# Patient Record
Sex: Female | Born: 1975 | Race: Black or African American | Hispanic: No | Marital: Married | Smoking: Current every day smoker
Health system: Southern US, Community
[De-identification: ages and names within clinical notes are randomized; demographics above are authoritative.]

## PROBLEM LIST (undated history)

## (undated) DIAGNOSIS — G589 Mononeuropathy, unspecified: Secondary | ICD-10-CM

## (undated) DIAGNOSIS — N809 Endometriosis, unspecified: Secondary | ICD-10-CM

## (undated) DIAGNOSIS — I10 Essential (primary) hypertension: Secondary | ICD-10-CM

## (undated) DIAGNOSIS — M549 Dorsalgia, unspecified: Secondary | ICD-10-CM

## (undated) DIAGNOSIS — K219 Gastro-esophageal reflux disease without esophagitis: Secondary | ICD-10-CM

## (undated) DIAGNOSIS — M5416 Radiculopathy, lumbar region: Secondary | ICD-10-CM

## (undated) DIAGNOSIS — F329 Major depressive disorder, single episode, unspecified: Secondary | ICD-10-CM

## (undated) DIAGNOSIS — I209 Angina pectoris, unspecified: Secondary | ICD-10-CM

## (undated) DIAGNOSIS — M419 Scoliosis, unspecified: Secondary | ICD-10-CM

## (undated) DIAGNOSIS — F32A Depression, unspecified: Secondary | ICD-10-CM

## (undated) DIAGNOSIS — G8929 Other chronic pain: Secondary | ICD-10-CM

## (undated) DIAGNOSIS — M1990 Unspecified osteoarthritis, unspecified site: Secondary | ICD-10-CM

## (undated) HISTORY — PX: TUBAL LIGATION: SHX77

## (undated) HISTORY — DX: Angina pectoris, unspecified: I20.9

## (undated) HISTORY — DX: Unspecified osteoarthritis, unspecified site: M19.90

## (undated) HISTORY — PX: OTHER SURGICAL HISTORY: SHX169

## (undated) HISTORY — DX: Mononeuropathy, unspecified: G58.9

## (undated) HISTORY — DX: Major depressive disorder, single episode, unspecified: F32.9

## (undated) HISTORY — DX: Depression, unspecified: F32.A

## (undated) HISTORY — PX: HERNIA REPAIR: SHX51

## (undated) HISTORY — PX: CYST REMOVAL HAND: SHX6279

## (undated) HISTORY — DX: Endometriosis, unspecified: N80.9

---

## 2007-11-20 ENCOUNTER — Emergency Department (HOSPITAL_COMMUNITY): Admission: EM | Admit: 2007-11-20 | Discharge: 2007-11-20 | Payer: Self-pay | Admitting: Emergency Medicine

## 2009-04-23 ENCOUNTER — Encounter: Payer: Self-pay | Admitting: Obstetrics & Gynecology

## 2010-11-29 LAB — POCT CARDIAC MARKERS: Myoglobin, poc: 46

## 2011-11-03 ENCOUNTER — Other Ambulatory Visit (HOSPITAL_COMMUNITY)
Admission: RE | Admit: 2011-11-03 | Discharge: 2011-11-03 | Disposition: A | Discharge status: Home / Self Care | Payer: Self-pay | Source: Ambulatory Visit | Attending: Obstetrics & Gynecology | Admitting: Obstetrics & Gynecology

## 2011-11-03 DIAGNOSIS — Z01419 Encounter for gynecological examination (general) (routine) without abnormal findings: Secondary | ICD-10-CM | POA: Insufficient documentation

## 2012-03-27 ENCOUNTER — Other Ambulatory Visit (HOSPITAL_COMMUNITY): Payer: Self-pay | Admitting: Family Medicine

## 2012-03-27 ENCOUNTER — Other Ambulatory Visit (HOSPITAL_COMMUNITY): Payer: Self-pay | Admitting: *Deleted

## 2012-03-27 DIAGNOSIS — R51 Headache: Secondary | ICD-10-CM

## 2012-03-30 ENCOUNTER — Ambulatory Visit (HOSPITAL_COMMUNITY): Payer: Medicaid Other

## 2012-04-02 ENCOUNTER — Ambulatory Visit (HOSPITAL_COMMUNITY)
Admission: RE | Admit: 2012-04-02 | Discharge: 2012-04-02 | Disposition: A | Discharge status: Home / Self Care | Payer: Medicaid Other | Source: Ambulatory Visit | Attending: Nurse Practitioner | Admitting: Nurse Practitioner

## 2012-04-02 ENCOUNTER — Encounter (HOSPITAL_COMMUNITY): Payer: Self-pay

## 2012-04-02 DIAGNOSIS — R51 Headache: Secondary | ICD-10-CM | POA: Insufficient documentation

## 2012-04-02 DIAGNOSIS — R42 Dizziness and giddiness: Secondary | ICD-10-CM | POA: Insufficient documentation

## 2012-04-02 DIAGNOSIS — J3489 Other specified disorders of nose and nasal sinuses: Secondary | ICD-10-CM | POA: Insufficient documentation

## 2012-04-02 HISTORY — DX: Essential (primary) hypertension: I10

## 2012-11-21 ENCOUNTER — Other Ambulatory Visit: Payer: Self-pay | Admitting: *Deleted

## 2012-11-22 MED ORDER — MEGESTROL ACETATE 40 MG PO TABS: 40.0000 mg | ORAL_TABLET | Freq: Every day | ORAL | Status: DC

## 2013-05-25 ENCOUNTER — Emergency Department (HOSPITAL_COMMUNITY)
Admission: EM | Admit: 2013-05-25 | Discharge: 2013-05-25 | Disposition: A | Discharge status: Home / Self Care | Payer: Medicaid Other | Attending: Emergency Medicine | Admitting: Emergency Medicine

## 2013-05-25 ENCOUNTER — Encounter (HOSPITAL_COMMUNITY): Payer: Self-pay | Admitting: Emergency Medicine

## 2013-05-25 DIAGNOSIS — Z79899 Other long term (current) drug therapy: Secondary | ICD-10-CM | POA: Insufficient documentation

## 2013-05-25 DIAGNOSIS — I10 Essential (primary) hypertension: Secondary | ICD-10-CM | POA: Insufficient documentation

## 2013-05-25 DIAGNOSIS — Y929 Unspecified place or not applicable: Secondary | ICD-10-CM | POA: Insufficient documentation

## 2013-05-25 DIAGNOSIS — IMO0002 Reserved for concepts with insufficient information to code with codable children: Secondary | ICD-10-CM | POA: Insufficient documentation

## 2013-05-25 DIAGNOSIS — W57XXXA Bitten or stung by nonvenomous insect and other nonvenomous arthropods, initial encounter: Secondary | ICD-10-CM

## 2013-05-25 DIAGNOSIS — F17200 Nicotine dependence, unspecified, uncomplicated: Secondary | ICD-10-CM | POA: Insufficient documentation

## 2013-05-25 DIAGNOSIS — Z8739 Personal history of other diseases of the musculoskeletal system and connective tissue: Secondary | ICD-10-CM | POA: Insufficient documentation

## 2013-05-25 DIAGNOSIS — Y939 Activity, unspecified: Secondary | ICD-10-CM | POA: Insufficient documentation

## 2013-05-25 HISTORY — DX: Scoliosis, unspecified: M41.9

## 2013-05-25 MED ORDER — DIPHENHYDRAMINE HCL 25 MG PO CAPS
25.0000 mg | ORAL_CAPSULE | Freq: Once | ORAL | Status: AC
  Administered 2013-05-25: 25 mg via ORAL
  Filled 2013-05-25: qty 1

## 2013-05-25 MED ORDER — PREDNISONE 10 MG PO TABS: ORAL_TABLET | ORAL | Status: DC

## 2013-05-25 MED ORDER — PREDNISONE 50 MG PO TABS
60.0000 mg | ORAL_TABLET | Freq: Once | ORAL | Status: AC
  Administered 2013-05-25: 60 mg via ORAL
  Filled 2013-05-25 (×2): qty 1

## 2013-05-25 MED ORDER — DIPHENHYDRAMINE HCL 25 MG PO TABS: 25.0000 mg | ORAL_TABLET | Freq: Four times a day (QID) | ORAL | Status: DC

## 2013-05-25 NOTE — Discharge Instructions (Signed)
Bedbugs  Bedbugs are tiny bugs that live in and around beds. During the day, they hide in mattresses and other places near beds. They come out at night and bite people lying in bed. They need blood to live and grow. Bedbugs can be found in beds anywhere. Usually, they are found in places where many people come and go (hotels, shelters, hospitals). It does not matter whether the place is dirty or clean.  Getting bitten by bedbugs rarely causes a medical problem. The biggest problem can be getting rid of them.  This often takes the work of a pest control expert.  CAUSES  · Less use of pesticides. Bedbugs were common before the 1950s. Then, strong pesticides such as DDT nearly wiped them out. Today, these pesticides are not used because they harm the environment and can cause health problems.  · More travel. Besides mattresses, bedbugs can also live in clothing and luggage. They can come along as people travel from place to place. Bedbugs are more common in certain parts of the world. When people travel to those areas, the bugs can come home with them.  · Presence of birds and bats. Bedbugs often infest birds and bats. If you have these animals in or near your home, bedbugs may infest your house, too.  SYMPTOMS  It does not hurt to be bitten by a bedbug. You will probably not wake up when you are bitten. Bedbugs usually bite areas of the skin that are not covered. Symptoms may show when you wake up, or they may take a day or more to show up. Symptoms may include:  · Small red bumps on the skin. These might be lined up in a row or clustered in a group.  · A darker red dot in the middle of red bumps.  · Blisters on the skin. There may be swelling and very bad itching. These may be signs of an allergic reaction. This does not happen often.  DIAGNOSIS  Bedbug bites might look and feel like other types of insect bites. The bugs do not stay on the body like ticks or lice. They bite, drop off, and crawl away to hide. Your  caregiver will probably:  · Ask about your symptoms.  · Ask about your recent activities and travel.  · Check your skin for bedbug bites.  · Ask you to check at home for signs of bedbugs. You should look for:  · Spots or stains on the bed or nearby. This could be from bedbugs that were crushed or from their eggs or waste.  · Bedbugs themselves. They are reddish-brown, oval, and flat. They do not fly. They are about the size of an apple seed.  · Places to look for bedbugs include:  · Beds. Check mattresses, headboards, box springs, and bed frames.  · On drapes and curtains near the bed.  · Under carpeting in the bedroom.  · Behind electrical outlets.  · Behind any wallpaper that is peeling.  · Inside luggage.  TREATMENT  Most bedbug bites do not need treatment. They usually go away on their own in a few days. The bites are not dangerous. However, treatment may be needed if you have scratched so much that your skin has become infected. You may also need treatment if you are allergic to bedbug bites. Treatment options include:  · A drug that stops swelling and itching (corticosteroid). Usually, a cream is rubbed on the skin. If you have a bad rash, you may be   given a corticosteroid pill.  · Oral antihistamines. These are pills to help control itching.  · Antibiotic medicines. An antibiotic may be prescribed for infected skin.  HOME CARE INSTRUCTIONS   · Take any medicine prescribed by your caregiver for your bites. Follow the directions carefully.  · Consider wearing pajamas with long sleeves and pant legs.  · Your bedroom may need to be treated. A pest control expert should make sure the bedbugs are gone. You may need to throw away mattresses or luggage. Ask the pest control expert what you can do to keep the bedbugs from coming back. Common suggestions include:  · Putting a plastic cover over your mattress.  · Washing and drying your clothes and bedding in hot water and a hot dryer. The temperature should be hotter  than 120° F (48.9° C). Bedbugs are killed by high temperatures.  · Vacuuming carefully all around your bed. Vacuum in all cracks and crevices where the bugs might hide. Do this often.  · Carefully checking all used furniture, bedding, or clothes that you bring into your house.  · Eliminating bird nests and bat roosts.  · If you get bedbug bites when traveling, check all your possessions carefully before bringing them into your house. If you find any bugs on clothes or in your luggage, consider throwing those items away.  SEEK MEDICAL CARE IF:  · You have red bug bites that keep coming back.  · You have red bug bites that itch badly.  · You have bug bites that cause a skin rash.  · You have scratch marks that are red and sore.  SEEK IMMEDIATE MEDICAL CARE IF:  You have a fever.  Document Released: 03/19/2010 Document Revised: 05/09/2011 Document Reviewed: 03/19/2010  [employer]® Patient Information ©2014 [employer].

## 2013-05-25 NOTE — ED Notes (Signed)
Pt c/o rash that started a week ago, c/o of areas itching and soreness, chills,

## 2013-05-25 NOTE — ED Provider Notes (Signed)
CSN: [id number]     Arrival date & time 05/25/13  0910 History   First MD Initiated Contact with Patient 05/25/13 0930     Chief Complaint  Patient presents with  . Rash     (Consider location/radiation/quality/duration/timing/severity/associated sxs/prior Treatment) Patient is a 38 y.o. female presenting with rash. The history is provided by the patient.  Rash Location:  Torso, face, head/neck and shoulder/arm Head/neck rash location:  L neck and R neck Shoulder/arm rash location:  L upper arm, R upper arm, L forearm and R forearm Torso rash location:  Lower back Quality: itchiness and redness   Quality: not blistering, not draining, not painful, not peeling, not scaling and not swelling   Severity:  Moderate Onset quality:  Gradual Duration:  1 week Timing:  Constant Progression:  Spreading Chronicity:  New Context: not medications and not new detergent/soap   Context comment:  Rash began after staying at her uncle's house Relieved by:  Nothing Worsened by:  Heat Ineffective treatments:  Anti-itch cream Associated symptoms: no abdominal pain, no fatigue, no fever, no headaches, no induration, no joint pain, no myalgias, no nausea, no periorbital edema, no shortness of breath, no sore throat, no throat swelling, no tongue swelling, no URI, not vomiting and not wheezing     Past Medical History  Diagnosis Date  . Hypertension   . Scoliosis    Past Surgical History  Procedure Laterality Date  . Cesarean section    . Cyst removal hand      left wrist   . Hernia repair     No family history on file. History  Substance Use Topics  . Smoking status: Current Every Day Smoker  . Smokeless tobacco: Not on file  . Alcohol Use: No   OB History   Grav Para Term Preterm Abortions TAB SAB Ect Mult Living                 Review of Systems  Constitutional: Negative for fever, chills, activity change, appetite change and fatigue.  HENT: Negative for congestion, facial  swelling, sore throat and trouble swallowing.   Respiratory: Negative for chest tightness, shortness of breath and wheezing.   Gastrointestinal: Negative for nausea, vomiting and abdominal pain.  Musculoskeletal: Negative for arthralgias, back pain, myalgias, neck pain and neck stiffness.  Skin: Positive for color change and rash. Negative for wound.  Neurological: Negative for dizziness, weakness, numbness and headaches.  All other systems reviewed and are negative.      Allergies  Review of patient's allergies indicates no known allergies.  Home Medications   Current Outpatient Rx  Name  Route  Sig  Dispense  Refill  . lisinopril (PRINIVIL,ZESTRIL) 10 MG tablet   Oral   Take 10 mg by mouth daily.         . traMADol (ULTRAM) 50 MG tablet   Oral   Take 50 mg by mouth 3 (three) times daily as needed for severe pain.          BP 169/96  Pulse 70  Temp(Src) 98.5 F (36.9 C) (Oral)  Resp 20  Ht 5\' 1"  (1.549 m)  Wt 152 lb (68.947 kg)  BMI 28.74 kg/m2  SpO2 100%  LMP 05/25/2013 Physical Exam  Nursing note and vitals reviewed. Constitutional: She is oriented to person, place, and time. She appears well-developed and well-nourished. No distress.  HENT:  Head: Normocephalic and atraumatic.  Mouth/Throat: Oropharynx is clear and moist.  Neck: Normal range of motion. Neck  supple.  Cardiovascular: Normal rate, regular rhythm, normal heart sounds and intact distal pulses.   No murmur heard. Pulmonary/Chest: Effort normal and breath sounds normal. No respiratory distress.  Musculoskeletal: She exhibits no edema and no tenderness.  Lymphadenopathy:    She has no cervical adenopathy.  Neurological: She is alert and oriented to person, place, and time. She exhibits normal muscle tone. Coordination normal.  Skin: Skin is warm. Rash noted. There is erythema.  Multiple , small erythematous papules to the upper torso, bilateral arms and lower back.  Several areas of excoriation  also present.  Feet and hands are spared.  Papules appear in linear patterns to the arms.  No vesicles or pustules.      ED Course  Procedures (including critical care time) Labs Review Labs Reviewed - No data to display Imaging Review No results found.   EKG Interpretation None      MDM   Final diagnoses:  Bed bug bite    Patient is well appearing. Vital signs are stable. No edema , airway patent.  Patient and family member have similar appearing rash. Appears consistent with bed bug bites.  Patient was advised to wash all bed linens in hot water and agrees to symptomatic treatment with Benadryl and prednisone taper.  Patient appears stable for discharge.    Lam Bjorklund L. Laroy Mustard, PA-C 05/27/13 1314

## 2013-05-30 NOTE — ED Provider Notes (Signed)
Medical screening examination/treatment/procedure(s) were performed by non-physician practitioner and as supervising physician I was immediately available for consultation/collaboration.   EKG Interpretation None       Dhillon Comunale, MD 05/30/13 0638 

## 2013-10-17 ENCOUNTER — Encounter: Payer: Self-pay | Admitting: Obstetrics & Gynecology

## 2013-10-17 ENCOUNTER — Ambulatory Visit (INDEPENDENT_AMBULATORY_CARE_PROVIDER_SITE_OTHER): Payer: Medicaid Other | Admitting: Obstetrics & Gynecology

## 2013-10-17 VITALS — BP 164/100 | Ht 61.0 in | Wt 156.0 lb

## 2013-10-17 DIAGNOSIS — N921 Excessive and frequent menstruation with irregular cycle: Secondary | ICD-10-CM

## 2013-10-17 DIAGNOSIS — N920 Excessive and frequent menstruation with regular cycle: Secondary | ICD-10-CM | POA: Insufficient documentation

## 2013-10-17 MED ORDER — MEGESTROL ACETATE 40 MG PO TABS: ORAL_TABLET | ORAL | Status: DC

## 2013-10-17 NOTE — Progress Notes (Signed)
Patient ID: Yvette West, female   DOB: 1975/10/12, 38 y.o.   MRN: [id number] Has been on megace in [past Bleeding for a month sometimes heavy crampy as well  Exam nefg Vagina pink moist bloody Uterus feels normal anteverted Adnexa negative  Past Medical History  Diagnosis Date  . Hypertension   . Scoliosis     Past Surgical History  Procedure Laterality Date  . Cesarean section    . Cyst removal hand      left wrist   . Hernia repair    . Btl      OB History   Grav Para Term Preterm Abortions TAB SAB Ect Mult Living                  No Known Allergies  History   Social History  . Marital Status: Single    Spouse Name: N/A    Number of Children: N/A  . Years of Education: N/A   Social History Main Topics  . Smoking status: Current Every Day Smoker  . Smokeless tobacco: None  . Alcohol Use: No  . Drug Use: No  . Sexual Activity: Yes   Other Topics Concern  . None   Social History Narrative  . None    Family History  Problem Relation Age of Onset  . Hypertension Mother

## 2013-11-18 ENCOUNTER — Ambulatory Visit (INDEPENDENT_AMBULATORY_CARE_PROVIDER_SITE_OTHER): Payer: Medicaid Other

## 2013-11-18 ENCOUNTER — Other Ambulatory Visit: Payer: Medicaid Other

## 2013-11-18 ENCOUNTER — Ambulatory Visit (INDEPENDENT_AMBULATORY_CARE_PROVIDER_SITE_OTHER): Payer: Medicaid Other | Admitting: Obstetrics & Gynecology

## 2013-11-18 ENCOUNTER — Ambulatory Visit: Payer: Medicaid Other | Admitting: Obstetrics & Gynecology

## 2013-11-18 ENCOUNTER — Other Ambulatory Visit: Payer: Self-pay | Admitting: Obstetrics & Gynecology

## 2013-11-18 ENCOUNTER — Encounter: Payer: Self-pay | Admitting: Obstetrics & Gynecology

## 2013-11-18 VITALS — BP 150/100 | Wt 155.0 lb

## 2013-11-18 DIAGNOSIS — N921 Excessive and frequent menstruation with irregular cycle: Secondary | ICD-10-CM

## 2013-11-18 DIAGNOSIS — N920 Excessive and frequent menstruation with regular cycle: Secondary | ICD-10-CM

## 2013-11-18 DIAGNOSIS — N925 Other specified irregular menstruation: Secondary | ICD-10-CM

## 2013-11-18 DIAGNOSIS — N949 Unspecified condition associated with female genital organs and menstrual cycle: Secondary | ICD-10-CM

## 2013-11-18 DIAGNOSIS — N938 Other specified abnormal uterine and vaginal bleeding: Secondary | ICD-10-CM

## 2013-11-18 NOTE — Progress Notes (Signed)
Patient ID: Yvette West, female   DOB: [birth year], 38 y.o.   MRN: [id number] US Transvaginal Non-ob  11/18/2013   GYNECOLOGIC SONOGRAM   Yvette West is a 38 y.o.  for a pelvic sonogram for menorrhagia and  DUB.  Uterus                      9.4 x 6.0 x 5.3 cm, anteverted no myometrial  masses noted within the pelvis  Endometrium          6.9 mm, symmetrical with small amount of fluid noted  within the cavity, Walls=3.97mm  Right ovary             1.8 x 1.4 x 1.2 cm,   Left ovary                2.3 x 1.8 x 1.7 cm,   No free fluid or adnexal masses noted within the pelvis  Technician Comments:  Anteverted uterus, Endom with small amount of fluid noted within the  cavity, bilateral adnexa/ovaries appear WNL no free fluid or adnexal masse  noted within the pelvis   Yvette West 11/18/2013 10:35 AM   Clinical Impression and recommendations:  I have reviewed the sonogram results above, combined with the patient's  current clinical course, below are my impressions and any appropriate  recommendations for management based on the sonographic findings.  Normal pelvic anatomy, no anatomical explanation for patient's symptoms   Yvette West 11/18/2013 10:50 AM    Chief Complaint  Patient presents with  . GYN VISIT    F-UP-U /S    HPI Menmetrorrhgia, minimal bleeding on megace, happy with amount of bleeding  ROS No burning with urination, frequency or urgency No nausea, vomiting or diarrhea Nor fever chills or other constitutional symptoms   Blood pressure 150/100, weight 155 lb (70.308 kg).  EXAM   Assessment/Plan:  Menometroorhagi, dysmenorrhea  Pt wants to continue megestrol for now Knows ablation probably in her future

## 2014-07-18 ENCOUNTER — Ambulatory Visit (INDEPENDENT_AMBULATORY_CARE_PROVIDER_SITE_OTHER): Payer: Medicaid Other | Admitting: Obstetrics & Gynecology

## 2014-07-18 ENCOUNTER — Other Ambulatory Visit (HOSPITAL_COMMUNITY)
Admission: RE | Admit: 2014-07-18 | Discharge: 2014-07-18 | Disposition: A | Discharge status: Home / Self Care | Payer: Medicaid Other | Source: Ambulatory Visit | Attending: Obstetrics & Gynecology | Admitting: Obstetrics & Gynecology

## 2014-07-18 ENCOUNTER — Encounter: Payer: Self-pay | Admitting: Obstetrics & Gynecology

## 2014-07-18 VITALS — BP 128/90 | HR 60 | Ht 62.0 in | Wt 172.0 lb

## 2014-07-18 DIAGNOSIS — N6452 Nipple discharge: Secondary | ICD-10-CM | POA: Insufficient documentation

## 2014-07-18 DIAGNOSIS — N643 Galactorrhea not associated with childbirth: Secondary | ICD-10-CM | POA: Diagnosis not present

## 2014-07-18 NOTE — Addendum Note (Signed)
Addended by: Criss AlvinePULLIAM, CHRYSTAL G on: 07/18/2014 12:47 PM   Modules accepted: Orders

## 2014-07-18 NOTE — Addendum Note (Signed)
Addended by: Criss AlvinePULLIAM, Skyleen Bentley G on: 07/18/2014 01:14 PM   Modules accepted: Orders

## 2014-07-18 NOTE — Progress Notes (Signed)
Patient ID: Yvette West, female   DOB: May 20, 1975, 39 y.o.   MRN: [id number]     Impression/Plan(Problem Based): 1.  Galactorrhea      (new problem) : Additional workup is needed:  TSH Prolactin and diagnostic mammogram    Follow Up:   prn  weeks   Will call when all results are in    Chief Complaint  Patient presents with  . right breast discharge with pain     HPI:    39 y.o. No obstetric history on file. Patient's last menstrual period was 06/18/2014.  Spontaneous bilateral breast discharge Location:  Both nipples. Quality:  minimal. Severity:  minimal. Timing:  episodic. Duration:  Short time. Context:  No breast discharge. Modifying factors:   Signs/Symptoms:      Current outpatient prescriptions:  .  diphenhydrAMINE (BENADRYL) 25 MG tablet, Take 1 tablet (25 mg total) by mouth every 6 (six) hours., Disp: 20 tablet, Rfl: 0 .  hydrochlorothiazide (HYDRODIURIL) 12.5 MG tablet, Take 12.5 mg by mouth daily., Disp: , Rfl:  .  megestrol (MEGACE) 40 MG tablet, 3 a day for 5 days, 2 a day, then 1 a day, Disp: 45 tablet, Rfl: 3 .  traMADol (ULTRAM) 50 MG tablet, Take 50 mg by mouth 3 (three) times daily as needed for severe pain., Disp: , Rfl:  .  lisinopril (PRINIVIL,ZESTRIL) 10 MG tablet, Take 10 mg by mouth daily., Disp: , Rfl:  .  predniSONE (DELTASONE) 10 MG tablet, Take 6 tablets day one, 5 tablets day two, 4 tablets day three, 3 tablets day four, 2 tablets day five, then 1 tablet day six (Patient not taking: Reported on 07/18/2014), Disp: 21 tablet, Rfl: 0  Problem Pertinent ROS:       No burning with urination, frequency or urgency No nausea, vomiting or diarrhea Nor fever chills or other constitutional symptoms   Extended ROS:        PMFSH:             Past Medical History  Diagnosis Date  . Hypertension   . Scoliosis     Past Surgical History  Procedure Laterality Date  . Cesarean section    . Cyst removal hand      left wrist   . Hernia  repair    . Btl      OB History    No data available      No Known Allergies  History   Social History  . Marital Status: Single    Spouse Name: N/A  . Number of Children: N/A  . Years of Education: N/A   Social History Main Topics  . Smoking status: Current Every Day Smoker -- 8.00 packs/day    Types: Cigarettes  . Smokeless tobacco: Never Used  . Alcohol Use: No  . Drug Use: No  . Sexual Activity: Yes   Other Topics Concern  . None   Social History Narrative    Family History  Problem Relation Age of Onset  . Hypertension Mother      Examination:  Vitals:  Blood pressure 128/90, pulse 60, height 5\' 2"  (1.575 m), weight 172 lb (78.019 kg), last menstrual period 06/18/2014.    Physical Examination:     General Appearance:  awake, alert, oriented, in no acute distress Breast:  No masses or skin lesions or skin changes, after stripping the breasts small amount of breast discharge noted on right, milky in appearance      DATA orders and reviews:  Labs were ordered today:  TSH prolactin Imaging studies were ordered today:  mammogram  Lab tests were not reviewed today:    Imaging studies were not reviewed today:    I did not independently review/view images, tracing or specimen(not simply the report) myself.        Face to face time:  15 minutes  Greater than 50% of the visit time was spent in counseling and coordination of care with the patient.  The summary and outline of the counseling and care coordination is summarized in the note above.   All questions were answered.

## 2014-07-19 LAB — TSH: TSH: 2.04 u[IU]/mL (ref 0.450–4.500)

## 2014-07-19 LAB — PROLACTIN: PROLACTIN: 13.7 ng/mL (ref 4.8–23.3)

## 2014-08-12 ENCOUNTER — Encounter (HOSPITAL_COMMUNITY): Payer: Medicaid Other

## 2014-09-02 ENCOUNTER — Encounter (HOSPITAL_COMMUNITY): Payer: Medicaid Other

## 2014-09-16 ENCOUNTER — Ambulatory Visit (HOSPITAL_COMMUNITY)
Admission: RE | Admit: 2014-09-16 | Discharge: 2014-09-16 | Disposition: A | Discharge status: Home / Self Care | Payer: Medicaid Other | Source: Ambulatory Visit | Attending: Obstetrics & Gynecology | Admitting: Obstetrics & Gynecology

## 2014-09-16 DIAGNOSIS — N643 Galactorrhea not associated with childbirth: Secondary | ICD-10-CM | POA: Insufficient documentation

## 2014-09-27 ENCOUNTER — Emergency Department (HOSPITAL_COMMUNITY)
Admission: EM | Admit: 2014-09-27 | Discharge: 2014-09-27 | Disposition: A | Discharge status: Home / Self Care | Payer: Medicaid Other | Attending: Emergency Medicine | Admitting: Emergency Medicine

## 2014-09-27 ENCOUNTER — Encounter (HOSPITAL_COMMUNITY): Payer: Self-pay | Admitting: Emergency Medicine

## 2014-09-27 DIAGNOSIS — M549 Dorsalgia, unspecified: Secondary | ICD-10-CM | POA: Diagnosis present

## 2014-09-27 DIAGNOSIS — I10 Essential (primary) hypertension: Secondary | ICD-10-CM | POA: Diagnosis not present

## 2014-09-27 DIAGNOSIS — Z720 Tobacco use: Secondary | ICD-10-CM | POA: Insufficient documentation

## 2014-09-27 DIAGNOSIS — Z79899 Other long term (current) drug therapy: Secondary | ICD-10-CM | POA: Diagnosis not present

## 2014-09-27 DIAGNOSIS — M545 Low back pain: Secondary | ICD-10-CM | POA: Insufficient documentation

## 2014-09-27 MED ORDER — PREDNISONE 20 MG PO TABS: 40.0000 mg | ORAL_TABLET | Freq: Every day | ORAL | Status: DC

## 2014-09-27 MED ORDER — METHOCARBAMOL 500 MG PO TABS: 500.0000 mg | ORAL_TABLET | Freq: Two times a day (BID) | ORAL | Status: DC

## 2014-09-27 NOTE — ED Provider Notes (Signed)
CSN: [id number]     Arrival date & time 09/27/14  1453 History   First MD Initiated Contact with Patient 09/27/14 [phone]     Chief Complaint  Patient presents with  . Back Pain     (Consider location/radiation/quality/duration/timing/severity/associated sxs/prior Treatment) Patient is a 39 y.o. female presenting with back pain. The history is provided by the patient and medical records.  Back Pain   This is a 39 y.o.  F with hx of HTN and scoliosis, presenting to the ED for back pain.  Patient states back pain began last night she was at work. She was lifting objects and turning to her left repeatedly. Pain localized to left lower back with some radiation into the left leg. She denies any numbness or weakness. No loss of bowel or bladder control. Patient does have history of scoliosis, she follows with an orthopedist for this. She is not taking any medications prior to arrival.  No fever, chills, hx of IVDU, or cancer.  VSS.  Past Medical History  Diagnosis Date  . Hypertension   . Scoliosis    Past Surgical History  Procedure Laterality Date  . Cesarean section    . Cyst removal hand      left wrist   . Hernia repair    . Btl     Family History  Problem Relation Age of Onset  . Hypertension Mother    History  Substance Use Topics  . Smoking status: Current Every Day Smoker -- 0.50 packs/day    Types: Cigarettes  . Smokeless tobacco: Never Used  . Alcohol Use: No   OB History    No data available     Review of Systems  Musculoskeletal: Positive for back pain.  All other systems reviewed and are negative.     Allergies  Lisinopril  Home Medications   Prior to Admission medications   Medication Sig Start Date End Date Taking? Authorizing Provider  hydrochlorothiazide (HYDRODIURIL) 12.5 MG tablet Take 12.5 mg by mouth daily.   Yes Historical Provider, MD  Multiple Vitamin (MULTIVITAMIN WITH MINERALS) TABS tablet Take 1 tablet by mouth daily.   Yes Historical  Provider, MD  diphenhydrAMINE (BENADRYL) 25 MG tablet Take 1 tablet (25 mg total) by mouth every 6 (six) hours. 05/25/13   Tammy Triplett, PA-C  megestrol (MEGACE) 40 MG tablet 3 a day for 5 days, 2 a day, then 1 a day 10/17/13   Lazaro Arms, MD  predniSONE (DELTASONE) 10 MG tablet Take 6 tablets day one, 5 tablets day two, 4 tablets day three, 3 tablets day four, 2 tablets day five, then 1 tablet day six Patient not taking: Reported on 07/18/2014 05/25/13   Tammy Triplett, PA-C   BP 127/78 mmHg  Pulse 94  Temp(Src) 98.7 F (37.1 C) (Oral)  Resp 18  Ht  (1.575 m)  Wt 162 lb (73.483 kg)  BMI 29.62 kg/m2  SpO2 100%  LMP 09/15/2014   Physical Exam  Constitutional: She is oriented to person, place, and time. She appears well-developed and well-nourished. No distress.  HENT:  Head: Normocephalic and atraumatic.  Mouth/Throat: Oropharynx is clear and moist.  Eyes: Conjunctivae and EOM are normal. Pupils are equal, round, and reactive to light.  Neck: Normal range of motion. Neck supple.  Cardiovascular: Normal rate, regular rhythm and normal heart sounds.   Pulmonary/Chest: Effort normal and breath sounds normal. No respiratory distress. She has no wheezes.  Musculoskeletal: Normal range of motion.  Lumbar back: She exhibits tenderness and pain.       Back:  Tenderness of left lumbar paraspinal region, no midline tenderness, step-off, or deformity; normal strength and sensation of bilateral lower extremities, DP pulses intact bilaterally; legs are NVI; normal gait  Neurological: She is alert and oriented to person, place, and time.  Skin: Skin is warm and dry. She is not diaphoretic.  Psychiatric: She has a normal mood and affect.  Nursing note and vitals reviewed.   ED Course  Procedures (including critical care time) Labs Review Labs Reviewed - No data to display  Imaging Review No results found.   EKG Interpretation None      MDM   Final diagnoses:  Back  pain, unspecified location   39 year old female here with back pain that began while working last time. She does admit to lifting and twisting prior to onset of pain. She has tenderness of her left lumbar paraspinal region without midline tenderness or deformity. She has normal strength and sensation of bilateral lower extremities. Her legs are neurovascularly intact. No clinical concern for cauda equina.  Will start patient on muscle relaxant and steroids for presumed lumbar strain with likely some component of sciatic nerve irritation. She will follow-up with her PCP.  Discussed plan with patient, he/she acknowledged understanding and agreed with plan of care.  Return precautions given for new or worsening symptoms.  Garlon Hatchet, PA-C 09/27/14 1626  Mancel Bale, MD 09/27/14 [phone]

## 2014-09-27 NOTE — ED Notes (Signed)
Pt states that she started having lower back pain into left hip while at work last night.

## 2014-09-27 NOTE — Discharge Instructions (Signed)
Take the prescribed medication as directed.  May also use heating pad or warm compressed to help relax back muscles. Follow-up with your primary care physician. Return to the ED for new or worsening symptoms.

## 2014-09-28 ENCOUNTER — Telehealth (HOSPITAL_COMMUNITY): Payer: Self-pay | Admitting: Emergency Medicine

## 2014-11-15 ENCOUNTER — Emergency Department (HOSPITAL_COMMUNITY)
Admission: EM | Admit: 2014-11-15 | Discharge: 2014-11-15 | Disposition: A | Discharge status: Home / Self Care | Payer: Medicaid Other | Attending: Emergency Medicine | Admitting: Emergency Medicine

## 2014-11-15 ENCOUNTER — Encounter (HOSPITAL_COMMUNITY): Payer: Self-pay | Admitting: Emergency Medicine

## 2014-11-15 DIAGNOSIS — M419 Scoliosis, unspecified: Secondary | ICD-10-CM | POA: Diagnosis not present

## 2014-11-15 DIAGNOSIS — G8929 Other chronic pain: Secondary | ICD-10-CM | POA: Diagnosis not present

## 2014-11-15 DIAGNOSIS — Z7952 Long term (current) use of systemic steroids: Secondary | ICD-10-CM | POA: Insufficient documentation

## 2014-11-15 DIAGNOSIS — Z720 Tobacco use: Secondary | ICD-10-CM | POA: Insufficient documentation

## 2014-11-15 DIAGNOSIS — I10 Essential (primary) hypertension: Secondary | ICD-10-CM | POA: Insufficient documentation

## 2014-11-15 DIAGNOSIS — Z8739 Personal history of other diseases of the musculoskeletal system and connective tissue: Secondary | ICD-10-CM

## 2014-11-15 DIAGNOSIS — Z79899 Other long term (current) drug therapy: Secondary | ICD-10-CM | POA: Insufficient documentation

## 2014-11-15 DIAGNOSIS — M545 Low back pain: Secondary | ICD-10-CM | POA: Diagnosis not present

## 2014-11-15 HISTORY — DX: Radiculopathy, lumbar region: M54.16

## 2014-11-15 HISTORY — DX: Dorsalgia, unspecified: M54.9

## 2014-11-15 HISTORY — DX: Other chronic pain: G89.29

## 2014-11-15 MED ORDER — INDOMETHACIN 25 MG PO CAPS
25.0000 mg | ORAL_CAPSULE | Freq: Once | ORAL | Status: AC
  Administered 2014-11-15: 25 mg via ORAL
  Filled 2014-11-15: qty 1

## 2014-11-15 MED ORDER — ONDANSETRON HCL 4 MG PO TABS
4.0000 mg | ORAL_TABLET | Freq: Once | ORAL | Status: AC
  Administered 2014-11-15: 4 mg via ORAL
  Filled 2014-11-15: qty 1

## 2014-11-15 MED ORDER — METHOCARBAMOL 500 MG PO TABS: 500.0000 mg | ORAL_TABLET | Freq: Three times a day (TID) | ORAL | Status: DC

## 2014-11-15 MED ORDER — DEXAMETHASONE 4 MG PO TABS: 4.0000 mg | ORAL_TABLET | Freq: Two times a day (BID) | ORAL | Status: DC

## 2014-11-15 MED ORDER — DIAZEPAM 5 MG PO TABS
5.0000 mg | ORAL_TABLET | Freq: Once | ORAL | Status: AC
  Administered 2014-11-15: 5 mg via ORAL
  Filled 2014-11-15: qty 1

## 2014-11-15 MED ORDER — IBUPROFEN 600 MG PO TABS: 600.0000 mg | ORAL_TABLET | Freq: Four times a day (QID) | ORAL | Status: DC | PRN

## 2014-11-15 MED ORDER — PREDNISONE 50 MG PO TABS
60.0000 mg | ORAL_TABLET | Freq: Once | ORAL | Status: AC
  Administered 2014-11-15: 60 mg via ORAL
  Filled 2014-11-15 (×2): qty 1

## 2014-11-15 NOTE — ED Provider Notes (Signed)
CSN: [id number]     Arrival date & time 11/15/14  1215 History   First MD Initiated Contact with Patient 11/15/14 1231     Chief Complaint  Patient presents with  . Back Pain     (Consider location/radiation/quality/duration/timing/severity/associated sxs/prior Treatment) Patient is a 39 y.o. female presenting with back pain. The history is provided by the patient.  Back Pain Location:  Lumbar spine Quality:  Stiffness (pulling sensation) Radiates to: right side. Pain severity:  Moderate Pain is:  Same all the time Timing:  Intermittent Progression:  Worsening Chronicity:  Chronic Context: lifting heavy objects   Context comment:  Lifting her 38 y/o daughter Relieved by:  Nothing Exacerbated by: sitting up. Ineffective treatments: tyllenol. Associated symptoms: no bladder incontinence, no bowel incontinence, no perianal numbness and no weakness   Risk factors comment:  Chroniv back pain   Past Medical History  Diagnosis Date  . Hypertension   . Scoliosis   . Chronic back pain     managed at [facility]  . Lumbar radiculopathy    Past Surgical History  Procedure Laterality Date  . Cesarean section    . Cyst removal hand      left wrist   . Hernia repair    . Btl     Family History  Problem Relation Age of Onset  . Hypertension Mother    Social History  Substance Use Topics  . Smoking status: Current Every Day Smoker -- 0.50 packs/day    Types: Cigarettes  . Smokeless tobacco: Never Used  . Alcohol Use: No   OB History    No data available     Review of Systems  Gastrointestinal: Negative for bowel incontinence.  Genitourinary: Negative for bladder incontinence.  Musculoskeletal: Positive for back pain.  Neurological: Negative for weakness.  All other systems reviewed and are negative.     Allergies  Lisinopril  Home Medications   Prior to Admission medications   Medication Sig Start Date End Date Taking? Authorizing Provider  diphenhydrAMINE  (BENADRYL) 25 MG tablet Take 1 tablet (25 mg total) by mouth every 6 (six) hours. 05/25/13   Tammy Triplett, PA-C  hydrochlorothiazide (HYDRODIURIL) 12.5 MG tablet Take 12.5 mg by mouth daily.    Historical Provider, MD  megestrol (MEGACE) 40 MG tablet 3 a day for 5 days, 2 a day, then 1 a day 10/17/13   Lazaro Arms, MD  methocarbamol (ROBAXIN) 500 MG tablet Take 1 tablet (500 mg total) by mouth 2 (two) times daily. 09/27/14   Garlon Hatchet, PA-C  Multiple Vitamin (MULTIVITAMIN WITH MINERALS) TABS tablet Take 1 tablet by mouth daily.    Historical Provider, MD  predniSONE (DELTASONE) 20 MG tablet Take 2 tablets (40 mg total) by mouth daily. Take 40 mg by mouth daily for 3 days, then  by mouth daily for 3 days, then  daily for 3 days 09/27/14   Garlon Hatchet, PA-C   BP 133/93 mmHg  Pulse 84  Temp(Src) 98.7 F (37.1 C) (Oral)  Resp 16  Ht  (1.549 m)  Wt 162 lb (73.483 kg)  BMI 30.63 kg/m2  SpO2 100%  LMP 11/09/2014 Physical Exam  Constitutional: She is oriented to person, place, and time. She appears well-developed and well-nourished.  Non-toxic appearance.  HENT:  Head: Normocephalic.  Right Ear: Tympanic membrane and external ear normal.  Left Ear: Tympanic membrane and external ear normal.  Eyes: EOM and lids are normal. Pupils are equal, round, and reactive to  light.  Neck: Normal range of motion. Neck supple. Carotid bruit is not present.  Cardiovascular: Normal rate, regular rhythm, normal heart sounds, intact distal pulses and normal pulses.   Pulmonary/Chest: Breath sounds normal. No respiratory distress.  Abdominal: Soft. Bowel sounds are normal. There is no tenderness. There is no guarding.  Musculoskeletal:       Lumbar back: She exhibits decreased range of motion, tenderness and pain.  Lymphadenopathy:       Head (right side): No submandibular adenopathy present.       Head (left side): No submandibular adenopathy present.    She has no cervical adenopathy.   Neurological: She is alert and oriented to person, place, and time. She has normal strength. No cranial nerve deficit or sensory deficit.  No gross motor or sensory deficit noted.  Skin: Skin is warm and dry.  Psychiatric: She has a normal mood and affect. Her speech is normal.  Nursing note and vitals reviewed.   ED Course  Procedures (including critical care time) Labs Review Labs Reviewed - No data to display  Imaging Review No results found. I have personally reviewed and evaluated these images and lab results as part of my medical decision-making.   EKG Interpretation None      MDM  Pt has a chronic back issue and hx of scoliosis. No gross neuro deficit. No evidence for cauda equina. Rx for robaxin ibuprofen, and decadron given to the patient. Pt referred to orthopedic MD   Final diagnoses:  None    **I have reviewed nursing notes, vital signs, and all appropriate lab and imaging results for this patient.Ivery Quale, PA-C 11/16/14 2244  Rolland Porter, MD 11/19/14 [phone]

## 2014-11-15 NOTE — Discharge Instructions (Signed)
Heating pad to the lower back may  Be helpful. Use decadron, robaxin, and ibuprofen as suggested. Robaxin may cause drowsiness, use with caution. Please see Ms Jarold Motto as soon as possible for evaluation and possible specialist referral. Back Pain, Adult Low back pain is very common. About 1 in 5 people have back pain.The cause of low back pain is rarely dangerous. The pain often gets better over time.About half of people with a sudden onset of back pain feel better in just 2 weeks. About 8 in 10 people feel better by 6 weeks.  CAUSES Some common causes of back pain include:  Strain of the muscles or ligaments supporting the spine.  Wear and tear (degeneration) of the spinal discs.  Arthritis.  Direct injury to the back. DIAGNOSIS Most of the time, the direct cause of low back pain is not known.However, back pain can be treated effectively even when the exact cause of the pain is unknown.Answering your caregiver's questions about your overall health and symptoms is one of the most accurate ways to make sure the cause of your pain is not dangerous. If your caregiver needs more information, he or she may order lab work or imaging tests (X-rays or MRIs).However, even if imaging tests show changes in your back, this usually does not require surgery. HOME CARE INSTRUCTIONS For many people, back pain returns.Since low back pain is rarely dangerous, it is often a condition that people can learn to [facility] their own.   Remain active. It is stressful on the back to sit or stand in one place. Do not sit, drive, or stand in one place for more than 30 minutes at a time. Take short walks on level surfaces as soon as pain allows.Try to increase the length of time you walk each day.  Do not stay in bed.Resting more than 1 or 2 days can delay your recovery.  Do not avoid exercise or work.Your body is made to move.It is not dangerous to be active, even though your back may hurt.Your back will  likely heal faster if you return to being active before your pain is gone.  Pay attention to your body when you bend and lift. Many people have less discomfortwhen lifting if they bend their knees, keep the load close to their bodies,and avoid twisting. Often, the most comfortable positions are those that put less stress on your recovering back.  Find a comfortable position to sleep. Use a firm mattress and lie on your side with your knees slightly bent. If you lie on your back, put a pillow under your knees.  Only take over-the-counter or prescription medicines as directed by your caregiver. Over-the-counter medicines to reduce pain and inflammation are often the most helpful.Your caregiver may prescribe muscle relaxant drugs.These medicines help dull your pain so you can more quickly return to your normal activities and healthy exercise.  Put ice on the injured area.  Put ice in a plastic bag.  Place a towel between your skin and the bag.  Leave the ice on for 15-20 minutes, 03-04 times a day for the first 2 to 3 days. After that, ice and heat may be alternated to reduce pain and spasms.  Ask your caregiver about trying back exercises and gentle massage. This may be of some benefit.  Avoid feeling anxious or stressed.Stress increases muscle tension and can worsen back pain.It is important to recognize when you are anxious or stressed and learn ways to manage it.Exercise is a great option. SEEK MEDICAL  CARE IF:  You have pain that is not relieved with rest or medicine.  You have pain that does not improve in 1 week.  You have new symptoms.  You are generally not feeling well. SEEK IMMEDIATE MEDICAL CARE IF:   You have pain that radiates from your back into your legs.  You develop new bowel or bladder control problems.  You have unusual weakness or numbness in your arms or legs.  You develop nausea or vomiting.  You develop abdominal pain.  You feel faint. Document  Released: 02/14/2005 Document Revised: 08/16/2011 Document Reviewed: 06/18/2013 [facility] Patient Information 2015 [place], [place]. This information is not intended to replace advice given to you by your health care provider. Make sure you discuss any questions you have with your health care provider.

## 2014-11-15 NOTE — ED Notes (Signed)
Patient with c/o lower back pain. Chronic, no injury. States Scoliosis.

## 2014-12-02 ENCOUNTER — Ambulatory Visit (HOSPITAL_COMMUNITY): Payer: Medicaid Other

## 2015-04-06 ENCOUNTER — Emergency Department (HOSPITAL_COMMUNITY)
Admission: EM | Admit: 2015-04-06 | Discharge: 2015-04-07 | Disposition: A | Discharge status: Home / Self Care | Payer: Medicaid Other | Attending: Emergency Medicine | Admitting: Emergency Medicine

## 2015-04-06 ENCOUNTER — Encounter (HOSPITAL_COMMUNITY): Payer: Self-pay | Admitting: Emergency Medicine

## 2015-04-06 ENCOUNTER — Emergency Department (HOSPITAL_COMMUNITY): Payer: Medicaid Other

## 2015-04-06 DIAGNOSIS — G8929 Other chronic pain: Secondary | ICD-10-CM | POA: Diagnosis not present

## 2015-04-06 DIAGNOSIS — I10 Essential (primary) hypertension: Secondary | ICD-10-CM | POA: Insufficient documentation

## 2015-04-06 DIAGNOSIS — R079 Chest pain, unspecified: Secondary | ICD-10-CM | POA: Diagnosis not present

## 2015-04-06 DIAGNOSIS — Z79899 Other long term (current) drug therapy: Secondary | ICD-10-CM | POA: Insufficient documentation

## 2015-04-06 DIAGNOSIS — H53149 Visual discomfort, unspecified: Secondary | ICD-10-CM | POA: Insufficient documentation

## 2015-04-06 DIAGNOSIS — F17210 Nicotine dependence, cigarettes, uncomplicated: Secondary | ICD-10-CM | POA: Diagnosis not present

## 2015-04-06 DIAGNOSIS — R42 Dizziness and giddiness: Secondary | ICD-10-CM | POA: Insufficient documentation

## 2015-04-06 DIAGNOSIS — R51 Headache: Secondary | ICD-10-CM | POA: Insufficient documentation

## 2015-04-06 DIAGNOSIS — M419 Scoliosis, unspecified: Secondary | ICD-10-CM | POA: Diagnosis not present

## 2015-04-06 LAB — CBC WITH DIFFERENTIAL/PLATELET
BASOS PCT: 0 %
Basophils Absolute: 0 10*3/uL (ref 0.0–0.1)
Eosinophils Absolute: 0.2 10*3/uL (ref 0.0–0.7)
Eosinophils Relative: 3 %
HEMATOCRIT: 35.3 % — AB (ref 36.0–46.0)
HEMOGLOBIN: 11.7 g/dL — AB (ref 12.0–15.0)
LYMPHS ABS: 4.1 10*3/uL — AB (ref 0.7–4.0)
Lymphocytes Relative: 51 %
MCH: 28.6 pg (ref 26.0–34.0)
MCHC: 33.1 g/dL (ref 30.0–36.0)
MCV: 86.3 fL (ref 78.0–100.0)
MONO ABS: 0.6 10*3/uL (ref 0.1–1.0)
MONOS PCT: 7 %
NEUTROS ABS: 3.1 10*3/uL (ref 1.7–7.7)
Neutrophils Relative %: 39 %
Platelets: 274 10*3/uL (ref 150–400)
RBC: 4.09 MIL/uL (ref 3.87–5.11)
RDW: 14 % (ref 11.5–15.5)
WBC: 8 10*3/uL (ref 4.0–10.5)

## 2015-04-06 LAB — TROPONIN I: Troponin I: <0.03 ng/mL (ref <0.031)

## 2015-04-06 LAB — BASIC METABOLIC PANEL
Anion gap: 8 (ref 5–15)
BUN: 8 mg/dL (ref 6–20)
CALCIUM: 9.3 mg/dL (ref 8.9–10.3)
CHLORIDE: 104 mmol/L (ref 101–111)
CO2: 29 mmol/L (ref 22–32)
CREATININE: 0.77 mg/dL (ref 0.44–1.00)
GFR calc non Af Amer: >60 mL/min (ref >60)
GLUCOSE: 83 mg/dL (ref 65–99)
Potassium: 3.4 mmol/L — ABNORMAL LOW (ref 3.5–5.1)
Sodium: 141 mmol/L (ref 135–145)

## 2015-04-06 LAB — HEPATIC FUNCTION PANEL
ALBUMIN: 4.5 g/dL (ref 3.5–5.0)
ALK PHOS: 46 U/L (ref 38–126)
ALT: 15 U/L (ref 14–54)
AST: 21 U/L (ref 15–41)
BILIRUBIN TOTAL: 0.4 mg/dL (ref 0.3–1.2)
Total Protein: 7.1 g/dL (ref 6.5–8.1)

## 2015-04-06 LAB — LIPASE, BLOOD: Lipase: 28 U/L (ref 11–51)

## 2015-04-06 MED ORDER — KETOROLAC TROMETHAMINE 30 MG/ML IJ SOLN
30.0000 mg | Freq: Once | INTRAMUSCULAR | Status: AC
  Administered 2015-04-06: 30 mg via INTRAVENOUS
  Filled 2015-04-06: qty 1

## 2015-04-06 MED ORDER — DIPHENHYDRAMINE HCL 50 MG/ML IJ SOLN
25.0000 mg | Freq: Once | INTRAMUSCULAR | Status: AC
  Administered 2015-04-06: 25 mg via INTRAVENOUS
  Filled 2015-04-06: qty 1

## 2015-04-06 MED ORDER — METOCLOPRAMIDE HCL 5 MG/ML IJ SOLN
10.0000 mg | Freq: Once | INTRAMUSCULAR | Status: AC
  Administered 2015-04-06: 10 mg via INTRAVENOUS
  Filled 2015-04-06: qty 2

## 2015-04-06 MED ORDER — GI COCKTAIL ~~LOC~~
30.0000 mL | Freq: Once | ORAL | Status: AC
  Administered 2015-04-06: 30 mL via ORAL

## 2015-04-06 NOTE — ED Notes (Signed)
Pt c/o stabbing left chest pain throughout the weekend with headache.

## 2015-04-06 NOTE — ED Provider Notes (Signed)
CSN: [id number]     Arrival date & time 04/06/15  1840 History  By signing my name below, I, [facility], attest that this documentation has been prepared under the direction and in the presence of Glynn Octave, MD. Electronically Signed: Randell Patient, ED Scribe. 04/06/2015. 3:09 AM.   Chief Complaint  Patient presents with  . Chest Pain   The history is provided by the patient. No language interpreter was used.   HPI Comments: Yvette West is a 40 y.o. female with an PMHx of HTN and scoliosis and PSHx of tubal ligation who presents to the Emergency Department complaining of moderate, unchanging, central CP with radiation to the left shoulder this morning onset 1 week ago that worsened from intermittent to constant 3 days ago. 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 She endorses intermittent HAs with photophobia for the past 4 days. Pain is worse with lying down and palpation and not improved by anything. She has taken Tylenol without relief and has been compliant with her HTN medications. Per patient, she notes similar HAs in the past. Denies an hx of DVT or recent long trips. She denies emesis, fevers, dyspnea with deep inspiration, dizziness currently, blurry vision, and back pain. NKDA.   PCP: Dr. Ninfa Linden Past Medical History  Diagnosis Date  . Hypertension   . Scoliosis   . Chronic back pain     managed at [facility]  . Lumbar radiculopathy    Past Surgical History  Procedure Laterality Date  . Cesarean section    . Cyst removal hand      left wrist   . Hernia repair    . Btl     Family History  Problem Relation Age of Onset  . Hypertension Mother    Social History  Substance Use Topics  . Smoking status: Current Every Day Smoker -- 0.50 packs/day    Types: Cigarettes  . Smokeless tobacco: Never Used  . Alcohol Use: No   OB History    No  data available     Review of Systems A complete 10 system review of systems was obtained and all systems are negative except as noted in the HPI and PMH.    Allergies  Lisinopril  Home Medications   Prior to Admission medications   Medication Sig Start Date End Date Taking? Authorizing Provider  gabapentin (NEURONTIN) 300 MG capsule Take 300 mg by mouth 3 (three) times daily. 02/10/15  Yes Historical Provider, MD  hydrochlorothiazide (HYDRODIURIL) 12.5 MG tablet Take 12.5 mg by mouth daily.   Yes Historical Provider, MD  traMADol (ULTRAM) 50 MG tablet Take 50 mg by mouth every 8 (eight) hours as needed. For pain 04/01/15 05/01/15 Yes Historical Provider, MD  omeprazole (PRILOSEC) 20 MG capsule Take 1 capsule (20 mg total) by mouth daily. 04/07/15   Glynn Octave, MD   BP 141/105 mmHg  Pulse 74  Temp(Src) 98.4 F (36.9 C) (Temporal)  Resp 16  Ht  (1.549 m)  Wt 172 lb (78.019 kg)  BMI 32.52 kg/m2  SpO2 97%  LMP 03/27/2015 Physical Exam  Constitutional: She is oriented to person, place, and time. She appears well-developed and well-nourished. No distress.  HENT:  Head: Normocephalic and atraumatic.  Mouth/Throat: Oropharynx is clear and moist. No oropharyngeal exudate.  Eyes: Conjunctivae and EOM are normal. Pupils are equal, round, and reactive to  light.  Neck: Normal range of motion. Neck supple.  No meningismus.  Cardiovascular: Normal rate, regular rhythm, normal heart sounds and intact distal pulses.   No murmur heard. Pulmonary/Chest: Effort normal and breath sounds normal. No respiratory distress. She exhibits tenderness.  Chest tenderness somewhat reproducible.   Abdominal: Soft. There is no tenderness. There is no rebound and no guarding.  Musculoskeletal: Normal range of motion. She exhibits no edema or tenderness.  Neurological: She is alert and oriented to person, place, and time. No cranial nerve deficit. She exhibits normal muscle tone. Coordination normal.  No  ataxia on finger to nose bilaterally. No pronator drift. 5/5 strength throughout. CN 2-12 intact.Equal grip strength. Sensation intact.   Skin: Skin is warm.  Psychiatric: She has a normal mood and affect. Her behavior is normal.  Nursing note and vitals reviewed.   ED Course  Procedures   DIAGNOSTIC STUDIES: Oxygen Saturation is 100% on RA, normal by my interpretation.    COORDINATION OF CARE: 8:51 PM Will order medications and labs. Discussed results of EKG. Discussed treatment plan with pt at bedside and pt agreed to plan.  Labs Review Labs Reviewed  CBC WITH DIFFERENTIAL/PLATELET - Abnormal; Notable for the following:    Hemoglobin 11.7 (*)    HCT 35.3 (*)    Lymphs Abs 4.1 (*)    All other components within normal limits  BASIC METABOLIC PANEL - Abnormal; Notable for the following:    Potassium 3.4 (*)    All other components within normal limits  HEPATIC FUNCTION PANEL - Abnormal; Notable for the following:    Bilirubin, Direct <0.1 (*)    All other components within normal limits  TROPONIN I  LIPASE, BLOOD  TROPONIN I    Imaging Review Dg Chest 2 View  04/06/2015  CLINICAL DATA:  Chest pain, shortness of breath when laying down for 3 days. History of smoking. EXAM: CHEST  2 VIEW COMPARISON:  11/20/2007 FINDINGS: Thoracolumbar scoliosis. Heart and mediastinal contours are within normal limits. No focal opacities or effusions. No acute bony abnormality. IMPRESSION: No active cardiopulmonary disease. Electronically Signed   By: Charlett Nose M.D.   On: 04/06/2015 19:36   Ct Head Wo Contrast  04/06/2015  CLINICAL DATA:  Hypertension on left-sided headache EXAM: CT HEAD WITHOUT CONTRAST TECHNIQUE: Contiguous axial images were obtained from the base of the skull through the vertex without intravenous contrast. COMPARISON:  04/02/2012 FINDINGS: No evidence abnormal attenuation to suggest hemorrhage extra-axial fluid infarct or mass. No hydrocephalus. Cerebellar tonsils may extend  into foramen magnum. IMPRESSION: No change from prior study. Possible Chiari 1 malformation not excluded. This would be better evaluated with MRI of the brain. Electronically Signed   By: Esperanza Heir M.D.   On: 04/06/2015 21:24   I have personally reviewed and evaluated these images and lab results as part of my medical decision-making.   EKG Interpretation   Date/Time:  Monday April 06 2015 19:04:08 EST Ventricular Rate:  80 PR Interval:  270 QRS Duration: 92 QT Interval:  386 QTC Calculation: 445 R Axis:   53 Text Interpretation:  Sinus rhythm with 1st degree A-V block Otherwise  normal ECG No significant change was found Confirmed by Manus Gunning  MD,  Makesha Belitz [phone]) on 04/06/2015 8:57:20 PM      MDM   Final diagnoses:  Chest pain, unspecified chest pain type   3 days of left-sided chest pain that is constant, worse with lying down and better with sitting up. Also describes  gradual onset headache similar to previous migraines. History of hypertension and states compliance with her medications. Denies thunderclap onset.  EKG is normal sinus rhythm. Chest wall is nontender. Chest x-ray negative. PERC negative. CT head negative  Some improvement in pain with GI cocktail. Somewhat reproducible.  Negative troponin x 2 with ongoing pain x 3 days.  ACS ruled out. Doubt PE or aortic dissection. Follow up with PCP and cardiology as may benefit from Stress test. Return precautions discussed.   I personally performed the services described in this documentation, which was scribed in my presence. The recorded information has been reviewed and is accurate.   Glynn Octave, MD 04/07/15 [phone]

## 2015-04-07 LAB — TROPONIN I: Troponin I: <0.03 ng/mL (ref <0.031)

## 2015-04-07 MED ORDER — OMEPRAZOLE 20 MG PO CPDR: 20.0000 mg | DELAYED_RELEASE_CAPSULE | Freq: Every day | ORAL | Status: DC

## 2015-04-07 NOTE — ED Notes (Signed)
Pt states understanding of care given and follow up instructions.  Ambulated from ED.  Driving self home.  Young son accompanying

## 2015-04-07 NOTE — Discharge Instructions (Signed)
Nonspecific Chest Pain  There is no evidence of heart attack. Follow up with the cardiologist for a stress test and take the stomach medication as prescribed.Return to the ED if you develop new or worsening symptoms. Chest pain can be caused by many different conditions. There is always a chance that your pain could be related to something serious, such as a heart attack or a blood clot in your lungs. Chest pain can also be caused by conditions that are not life-threatening. If you have chest pain, it is very important to follow up with your health care provider. CAUSES  Chest pain can be caused by:  Heartburn.  Pneumonia or bronchitis.  Anxiety or stress.  Inflammation around your heart (pericarditis) or lung (pleuritis or pleurisy).  A blood clot in your lung.  A collapsed lung (pneumothorax). It can develop suddenly on its own (spontaneous pneumothorax) or from trauma to the chest.  Shingles infection (varicella-zoster virus).  Heart attack.  Damage to the bones, muscles, and cartilage that make up your chest wall. This can include:  Bruised bones due to injury.  Strained muscles or cartilage due to frequent or repeated coughing or overwork.  Fracture to one or more ribs.  Sore cartilage due to inflammation (costochondritis). RISK FACTORS  Risk factors for chest pain may include:  Activities that increase your risk for trauma or injury to your chest.  Respiratory infections or conditions that cause frequent coughing.  Medical conditions or overeating that can cause heartburn.  Heart disease or family history of heart disease.  Conditions or health behaviors that increase your risk of developing a blood clot.  Having had chicken pox (varicella zoster). SIGNS AND SYMPTOMS Chest pain can feel like:  Burning or tingling on the surface of your chest or deep in your chest.  Crushing, pressure, aching, or squeezing pain.  Dull or sharp pain that is worse when you move,  cough, or take a deep breath.  Pain that is also felt in your back, neck, shoulder, or arm, or pain that spreads to any of these areas. Your chest pain may come and go, or it may stay constant. DIAGNOSIS Lab tests or other studies may be needed to find the cause of your pain. Your health care provider may have you take a test called an ambulatory ECG (electrocardiogram). An ECG records your heartbeat patterns at the time the test is performed. You may also have other tests, such as:  Transthoracic echocardiogram (TTE). During echocardiography, sound waves are used to create a picture of all of the heart structures and to look at how blood flows through your heart.  Transesophageal echocardiogram (TEE).This is a more advanced imaging test that obtains images from inside your body. It allows your health care provider to see your heart in finer detail.  Cardiac monitoring. This allows your health care provider to monitor your heart rate and rhythm in real time.  Holter monitor. This is a portable device that records your heartbeat and can help to diagnose abnormal heartbeats. It allows your health care provider to track your heart activity for several days, if needed.  Stress tests. These can be done through exercise or by taking medicine that makes your heart beat more quickly.  Blood tests.  Imaging tests. TREATMENT  Your treatment depends on what is causing your chest pain. Treatment may include:  Medicines. These may include:  Acid blockers for heartburn.  Anti-inflammatory medicine.  Pain medicine for inflammatory conditions.  Antibiotic medicine, if an infection  is present.  Medicines to dissolve blood clots.  Medicines to treat coronary artery disease.  Supportive care for conditions that do not require medicines. This may include:  Resting.  Applying heat or cold packs to injured areas.  Limiting activities until pain decreases. HOME CARE INSTRUCTIONS  If you were  prescribed an antibiotic medicine, finish it all even if you start to feel better.  Avoid any activities that bring on chest pain.  Do not use any tobacco products, including cigarettes, chewing tobacco, or electronic cigarettes. If you need help quitting, ask your health care provider.  Do not drink alcohol.  Take medicines only as directed by your health care provider.  Keep all follow-up visits as directed by your health care provider. This is important. This includes any further testing if your chest pain does not go away.  If heartburn is the cause for your chest pain, you may be told to keep your head raised (elevated) while sleeping. This reduces the chance that acid will go from your stomach into your esophagus.  Make lifestyle changes as directed by your health care provider. These may include:  Getting regular exercise. Ask your health care provider to suggest some activities that are safe for you.  Eating a heart-healthy diet. A registered dietitian can help you to learn healthy eating options.  Maintaining a healthy weight.  Managing diabetes, if necessary.  Reducing stress. SEEK MEDICAL CARE IF:  Your chest pain does not go away after treatment.  You have a rash with blisters on your chest.  You have a fever. SEEK IMMEDIATE MEDICAL CARE IF:   Your chest pain is worse.  You have an increasing cough, or you cough up blood.  You have severe abdominal pain.  You have severe weakness.  You faint.  You have chills.  You have sudden, unexplained chest discomfort.  You have sudden, unexplained discomfort in your arms, back, neck, or jaw.  You have shortness of breath at any time.  You suddenly start to sweat, or your skin gets clammy.  You feel nauseous or you vomit.  You suddenly feel light-headed or dizzy.  Your heart begins to beat quickly, or it feels like it is skipping beats. These symptoms may represent a serious problem that is an emergency. Do  not wait to see if the symptoms will go away. Get medical help right away. Call your local emergency services (911 in the [place] Do not drive yourself to the hospital.   This information is not intended to replace advice given to you by your health care provider. Make sure you discuss any questions you have with your health care provider.   Document Released: 11/24/2004 Document Revised: 03/07/2014 Document Reviewed: 09/20/2013 Elsevier Interactive Patient Education [employer].

## 2015-05-19 ENCOUNTER — Telehealth: Payer: Self-pay | Admitting: Obstetrics & Gynecology

## 2015-05-19 ENCOUNTER — Other Ambulatory Visit: Payer: Self-pay | Admitting: Obstetrics & Gynecology

## 2015-05-19 MED ORDER — MEGESTROL ACETATE 40 MG PO TABS: ORAL_TABLET | ORAL | Status: DC

## 2015-05-19 NOTE — Telephone Encounter (Signed)
Megace refilled. 

## 2015-05-19 NOTE — Telephone Encounter (Signed)
Pt requesting a refill on Megace.  

## 2015-05-19 NOTE — Telephone Encounter (Signed)
Pt called stating that she would like a refill of her medication. Please contact pt °

## 2015-08-10 ENCOUNTER — Encounter: Payer: Self-pay | Admitting: *Deleted

## 2015-08-12 ENCOUNTER — Ambulatory Visit: Payer: Medicaid Other | Admitting: Obstetrics & Gynecology

## 2015-08-20 ENCOUNTER — Ambulatory Visit: Payer: Medicaid Other | Admitting: Obstetrics & Gynecology

## 2015-08-25 ENCOUNTER — Ambulatory Visit (INDEPENDENT_AMBULATORY_CARE_PROVIDER_SITE_OTHER): Payer: Medicaid Other | Admitting: Obstetrics & Gynecology

## 2015-08-25 ENCOUNTER — Encounter: Payer: Self-pay | Admitting: Obstetrics & Gynecology

## 2015-08-25 VITALS — BP 110/70 | HR 76 | Ht 62.0 in | Wt 173.0 lb

## 2015-08-25 DIAGNOSIS — N946 Dysmenorrhea, unspecified: Secondary | ICD-10-CM

## 2015-08-25 DIAGNOSIS — N921 Excessive and frequent menstruation with irregular cycle: Secondary | ICD-10-CM | POA: Diagnosis not present

## 2015-08-25 NOTE — Progress Notes (Signed)
Patient ID: Yvette West, female   DOB: 1976-02-13, 40 y.o.   MRN: [id number]      Chief Complaint  Patient presents with  . Referral    menerrhagic/irregular period    Blood pressure 110/70, pulse 76, height 5\' 2"  (1.575 m), weight 173 lb (78.5 kg), last menstrual period 07/12/2015.  40 y.o. G5P0 Patient's last menstrual period was 07/12/2015. The current method of family planning is tubal ligation.  Outpatient Encounter Prescriptions as of 08/25/2015  Medication Sig Note  . gabapentin (NEURONTIN) 300 MG capsule Take 300 mg by mouth 3 (three) times daily. 04/06/2015: [facility] Received Sig: Take by mouth.  . hydrochlorothiazide (HYDRODIURIL) 12.5 MG tablet Take 12.5 mg by mouth daily.   Marland Kitchen. LOSARTAN POTASSIUM PO Take 25 mg by mouth daily.   . Multiple Vitamin (MULTIVITAMIN) tablet Take 1 tablet by mouth daily.   . [DISCONTINUED] megestrol (MEGACE) 40 MG tablet 3 tablets a day for 5 days, 2 tablets a day for 5 days then 1 tablet daily   . [DISCONTINUED] CYCLOBENZAPRINE HCL PO Take 10 mg by mouth daily.   . [DISCONTINUED] HYDROcodone-acetaminophen (NORCO) 7.5-325 MG tablet Take 1-2 tablets by mouth every 8 (eight) hours as needed for moderate pain.   . [DISCONTINUED] omeprazole (PRILOSEC) 20 MG capsule Take 1 capsule (20 mg total) by mouth daily.   . [DISCONTINUED] Oxycodone HCl 10 MG TABS Take 10 mg by mouth every 6 (six) hours.   . [DISCONTINUED] predniSONE (DELTASONE) 20 MG tablet Take 20 mg by mouth daily with breakfast.    No facility-administered encounter medications on file as of 08/25/2015.     Subjective Bleeding for >10 days monthly, heavy with clots and associated cramps Spots between +soils clothes and sheets varabile volume  Objective Gen WDWN female NSD Vulva:  normal appearing vulva with no masses, tenderness or lesions Vagina:  normal mucosa, no discharge, no discharge, no  lesions Cervix:  no lesions and pain with anterior cervical  displacement Uterus:  normal size, contour, position, consistency, mobility, non-tender Adnexa: ovaries:present,  normal adnexa in size, nontender and no masses   Pertinent ROS No burning with urination, frequency or urgency No nausea, vomiting or diarrhea Nor fever chills or other constitutional symptoms   Labs or studies     Impression Diagnoses this Encounter::   ICD-9-CM ICD-10-CM   1. Menorrhagia with irregular cycle 626.2 N92.1   2. Dysmenorrhea 625.3 N94.6     Established relevant diagnosis(es):   Plan/Recommendations: No orders of the defined types were placed in this encounter.   Labs or Scans Ordered: No orders of the defined types were placed in this encounter.   Management:: Megestrol therapy Sonogram evaluation of the endometrium  Follow up Return if symptoms worsen or fail to improve.          All questions were answered.  Past Medical History:  Diagnosis Date  . Anginal pain (HCC)   . Arthritis   . Chronic back pain    [facility]  . Depression   . GERD (gastroesophageal reflux disease)   . Hypertension   . Lumbar radiculopathy   . Scoliosis   . Scoliosis     Past Surgical History:  Procedure Laterality Date  . BTL    . CESAREAN SECTION     x2  . CYST REMOVAL HAND     left wrist   . DILITATION & CURRETTAGE/HYSTROSCOPY WITH NOVASURE ABLATION N/A 10/14/2015   Procedure: DILATATION & CURETTAGE/HYSTEROSCOPY WITH NOVASURE  ABLATION;  Surgeon: Lazaro ArmsLuther H Karlin Heilman, MD;  Location: [facility];  Service: Gynecology;  Laterality: N/A;  novasure length 6.5 width 4.5 power 161 time 47 seconds  . HERNIA REPAIR     umbilical  . TUBAL LIGATION      OB History    Gravida Para Term Preterm AB Living   5         5   SAB TAB Ectopic Multiple Live Births                  Allergies  Allergen Reactions  . Lisinopril Cough    Social History   Social History  . Marital status: Single    Spouse name: N/A  . Number of children: N/A  .  Years of education: N/A   Social History Main Topics  . Smoking status: Current Every Day Smoker    Packs/day: 0.25    Years: 20.00    Types: Cigarettes  . Smokeless tobacco: Never Used  . Alcohol use No  . Drug use: No  . Sexual activity: Yes    Birth control/ protection: Surgical   Other Topics Concern  . None   Social History Narrative  . None    Family History  Problem Relation Age of Onset  . Hypertension Mother   . Cancer Sister     cervical  . Cancer Maternal Grandmother     colon

## 2015-09-30 ENCOUNTER — Ambulatory Visit (INDEPENDENT_AMBULATORY_CARE_PROVIDER_SITE_OTHER): Payer: Medicaid Other | Admitting: Obstetrics & Gynecology

## 2015-09-30 ENCOUNTER — Encounter: Payer: Self-pay | Admitting: Obstetrics & Gynecology

## 2015-09-30 VITALS — BP 120/70 | HR 74 | Ht 61.0 in | Wt 173.0 lb

## 2015-09-30 DIAGNOSIS — N921 Excessive and frequent menstruation with irregular cycle: Secondary | ICD-10-CM | POA: Diagnosis not present

## 2015-09-30 DIAGNOSIS — N946 Dysmenorrhea, unspecified: Secondary | ICD-10-CM | POA: Diagnosis not present

## 2015-09-30 NOTE — Progress Notes (Signed)
      Chief Complaint  Patient presents with  . Follow-up    c/o still bleeding not any better/ nausea. talk options    Blood pressure 120/70, pulse 74, height 5\' 1"  (1.549 m), weight 173 lb (78.5 kg).  40 y.o. G5P0 No LMP recorded. The current method of family planning is megestrol.  Subjective On megace bleeds every day Some days light and brwon, some days heavy red Associated cramps every day, variable, no meds 25% dyspareunia Megace also makes her nauseated   Objective   Pertinent ROS No burning with urination, frequency or urgency No nausea, vomiting or diarrhea Nor fever chills or other constitutional symptoms   Labs or studies Reviewed sonogram 10/2013    Impression Diagnoses this Encounter::   ICD-9-CM ICD-10-CM   1. Menorrhagia with irregular cycle 626.2 N92.1   2. Dysmenorrhea 625.3 N94.6     Established relevant diagnosis(es): Past Medical History:  Diagnosis Date  . Anginal pain (HCC)   . Arthritis   . Chronic back pain    managed at [facility]  . Depression   . Hypertension   . Lumbar radiculopathy   . Scoliosis   . Scoliosis      Plan/Recommendations: No orders of the defined types were placed in this encounter.   Labs or Scans Ordered: No orders of the defined types were placed in this encounter.   Management:: As before have recommended ablation, pt not interested in hysterectomy as a solution for the dyspareunia as well  Follow up Return in about 3 weeks (around 10/21/2015).  Pt deciding on timing of ablation in the meantime, tentatiely set for 10/14/2015        Face to face time:  15 minutes  Greater than 50% of the visit time was spent in counseling and coordination of care with the patient.  The summary and outline of the counseling and care coordination is summarized in the note above.   All questions were answered.

## 2015-10-09 NOTE — Patient Instructions (Signed)
Yvette West  10/09/2015     @   Your procedure is scheduled on  10/14/2015   Report to [facility] at  730 A.M.  Call this number if you have problems the morning of surgery:  [phone]   Remember:  Do not eat food or drink liquids after midnight.  Take these medicines the morning of surgery with A SIP OF WATER  Neurontin, losartan.   Do not wear jewelry, make-up or nail polish.  Do not wear lotions, powders, or perfumes.  You may wear deoderant.  Do not shave 48 hours prior to surgery.  Men may shave face and neck.  Do not bring valuables to the hospital.  [facility] is not responsible for any belongings or valuables.  Contacts, dentures or bridgework may not be worn into surgery.  Leave your suitcase in the car.  After surgery it may be brought to your room.  For patients admitted to the hospital, discharge time will be determined by your treatment team.  Patients discharged the day of surgery will not be allowed to drive home.   Name and phone number of your driver:   family Special instructions:  none  Please read over the following fact sheets that you were given. Anesthesia Post-op Instructions and Care and Recovery After Surgery       Endometrial Ablation Endometrial ablation removes the lining of the uterus (endometrium). It is usually a same-day, outpatient treatment. Ablation helps avoid major surgery, such as surgery to remove the cervix and uterus (hysterectomy). After endometrial ablation, you will have little or no menstrual bleeding and may not be able to have children. However, if you are premenopausal, you will need to use a reliable method of birth control following the procedure because of the small chance that pregnancy can occur. There are different reasons to have this procedure. These reasons include:  Heavy periods.  Bleeding that is causing anemia.  Irregular bleeding.  Bleeding fibroids on the  lining inside the uterus if they are smaller than 3 centimeters. This procedure may not be possible for you if:   You want to have children in the future.   You have severe cramps with your menstrual period.   You have precancerous or cancerous cells in your uterus.   You were recently pregnant.   You have gone through menopause.   You have had major surgery on your uterus, resulting in thinning of the uterine wall. Surgeries may include:  The removal of one or more uterine fibroids (myomectomy).  A cesarean section with a classic (vertical) incision on your uterus. Ask your health care provider what type of cesarean you had. Sometimes the scar on your skin is different than the scar on your uterus. Even if you have had surgery on your uterus, certain types of ablation may still be safe for you. Talk with your health care provider. LET [facility] CARE PROVIDER KNOW ABOUT:  Any allergies you have.  All medicines you are taking, including vitamins, herbs, eye drops, creams, and over-the-counter medicines.  Previous problems you or members of your family have had with the use of anesthetics.  Any blood disorders you have.  Previous surgeries you have had.  Medical conditions you have. RISKS AND COMPLICATIONS  Generally, this is a safe procedure. However, as with any procedure, complications can occur. Possible complications include:  Perforation of the uterus.  Bleeding.  Infection of the uterus, bladder, or vagina.  Injury to surrounding organs.  An air bubble to the lung (air embolus).  Pregnancy following the procedure.  Failure of the procedure to help the problem, requiring hysterectomy.  Decreased ability to diagnose cancer in the lining of the uterus. BEFORE THE PROCEDURE  The lining of the uterus must be tested to make sure there is no pre-cancerous or cancer cells present.  An ultrasound may be performed to look at the size of the uterus and to check  for abnormalities.  Medicines may be given to thin the lining of the uterus. PROCEDURE  During the procedure, your health care provider will use a tool called a resectoscope to help see inside your uterus. There are different ways to remove the lining of your uterus.   Radiofrequency - This method uses a radiofrequency-alternating electric current to remove the lining of the uterus.  Cryotherapy - This method uses extreme cold to freeze the lining of the uterus.  Heated-Free Liquid - This method uses heated salt (saline) solution to remove the lining of the uterus.  Microwave - This method uses high-energy microwaves to heat up the lining of the uterus to remove it.  Thermal balloon - This method involves inserting a catheter with a balloon tip into the uterus. The balloon tip is filled with heated fluid to remove the lining of the uterus. AFTER THE PROCEDURE  After your procedure, do not have sexual intercourse or insert anything into your vagina until permitted by your health care provider. After the procedure, you may experience:  Cramps.  Vaginal discharge.  Frequent urination.   This information is not intended to replace advice given to you by your health care provider. Make sure you discuss any questions you have with your health care provider.   Document Released: 12/25/2003 Document Revised: 11/05/2014 Document Reviewed: 07/18/2012 Elsevier Interactive Patient Education 2016 Elsevier Inc. Dilation and Curettage or Vacuum Curettage Dilation and curettage (D&C) and vacuum curettage are minor procedures. A D&C involves stretching (dilation) the cervix and scraping (curettage) the inside lining of the womb (uterus). During a D&C, tissue is gently scraped from the inside lining of the uterus. During a vacuum curettage, the lining and tissue in the uterus are removed with the use of gentle suction.  Curettage may be performed to either diagnose or treat a problem. As a diagnostic  procedure, curettage is performed to examine tissues from the uterus. A diagnostic curettage may be performed for the following symptoms:   Irregular bleeding in the uterus.   Bleeding with the development of clots.   Spotting between menstrual periods.   Prolonged menstrual periods.   Bleeding after menopause.   No menstrual period (amenorrhea).   A change in size and shape of the uterus.  As a treatment procedure, curettage may be performed for the following reasons:   Removal of an IUD (intrauterine device).   Removal of retained placenta after giving birth. Retained placenta can cause an infection or bleeding severe enough to require transfusions.   Abortion.   Miscarriage.   Removal of polyps inside the uterus.   Removal of uncommon types of noncancerous lumps (fibroids).  LET [facility] CARE PROVIDER KNOW ABOUT:   Any allergies you have.   All medicines you are taking, including vitamins, herbs, eye drops, creams, and over-the-counter medicines.   Previous problems you or members of your family have had with the use of anesthetics.   Any blood disorders you have.  Previous surgeries you have had.   Medical conditions you have. RISKS AND COMPLICATIONS  Generally, this is a safe procedure. However, as with any procedure, complications can occur. Possible complications include:  Excessive bleeding.   Infection of the uterus.   Damage to the cervix.   Development of scar tissue (adhesions) inside the uterus, later causing abnormal amounts of menstrual bleeding.   Complications from the general anesthetic, if a general anesthetic is used.   Putting a hole (perforation) in the uterus. This is rare.  BEFORE THE PROCEDURE   Eat and drink before the procedure only as directed by your health care provider.   Arrange for someone to take you home.  PROCEDURE  This procedure usually takes about 15-30 minutes.  You will be given one  of the following:  A medicine that numbs the area in and around the cervix (local anesthetic).   A medicine to make you sleep through the procedure (general anesthetic).  You will lie on your back with your legs in stirrups.   A warm metal or plastic instrument (speculum) will be placed in your vagina to keep it open and to allow the health care provider to see the cervix.  There are two ways in which your cervix can be softened and dilated. These include:   Taking a medicine.   Having thin rods (laminaria) inserted into your cervix.   A curved tool (curette) will be used to scrape cells from the inside lining of the uterus. In some cases, gentle suction is applied with the curette. The curette will then be removed.  AFTER THE PROCEDURE   You will rest in the recovery area until you are stable and are ready to go home.   You may feel sick to your stomach (nauseous) or throw up (vomit) if you were given a general anesthetic.   You may have a sore throat if a tube was placed in your throat during general anesthesia.   You may have light cramping and bleeding. This may last for 2 days to 2 weeks after the procedure.   Your uterus needs to make a new lining after the procedure. This may make your next period late.   This information is not intended to replace advice given to you by your health care provider. Make sure you discuss any questions you have with your health care provider.   Document Released: 02/14/2005 Document Revised: 10/17/2012 Document Reviewed: 09/13/2012 Elsevier Interactive Patient Education 2016 Elsevier Inc. Dilation and Curettage or Vacuum Curettage, Care After Refer to this sheet in the next few weeks. These instructions provide you with information on caring for yourself after your procedure. Your health care provider may also give you more specific instructions. Your treatment has been planned according to current medical practices, but problems  sometimes occur. Call your health care provider if you have any problems or questions after your procedure. WHAT TO EXPECT AFTER THE PROCEDURE After your procedure, it is typical to have light cramping and bleeding. This may last for 2 days to 2 weeks after the procedure. HOME CARE INSTRUCTIONS   Do not drive for 24 hours.  Wait 1 week before returning to strenuous activities.  Take your temperature 2 times a day for 4 days and write it down. Provide these temperatures to your health care provider if you develop a fever.  Avoid long periods of standing.  Avoid heavy lifting, pushing, or pulling. Do not lift anything heavier than 10 pounds (4.5 kg).  Limit  stair climbing to once or twice a day.  Take rest periods often.  You may resume your usual diet.  Drink enough fluids to keep your urine clear or pale yellow.  Your usual bowel function should return. If you have constipation, you may:  Take a mild laxative with permission from your health care provider.  Add fruit and bran to your diet.  Drink more fluids.  Take showers instead of baths until your health care provider gives you permission to take baths.  Do not go swimming or use a hot tub until your health care provider approves.  Try to have someone with you or available to you the first 24-48 hours, especially if you were given a general anesthetic.  Do not douche, use tampons, or have sex (intercourse) for 2 weeks after the procedure.  Only take over-the-counter or prescription medicines as directed by your health care provider. Do not take aspirin. It can cause bleeding.  Follow up with your health care provider as directed. SEEK MEDICAL CARE IF:   You have increasing cramps or pain that is not relieved with medicine.  You have abdominal pain that does not seem to be related to the same area of earlier cramping and pain.  You have bad smelling vaginal discharge.  You have a rash.  You are having problems  with any medicine. SEEK IMMEDIATE MEDICAL CARE IF:   You have bleeding that is heavier than a normal menstrual period.  You have a fever.  You have chest pain.  You have shortness of breath.  You feel dizzy or feel like fainting.  You pass out.  You have pain in your shoulder strap area.  You have heavy vaginal bleeding with or without blood clots. MAKE SURE YOU:   Understand these instructions.  Will watch your condition.  Will get help right away if you are not doing well or get worse.   This information is not intended to replace advice given to you by your health care provider. Make sure you discuss any questions you have with your health care provider.   Document Released: 02/12/2000 Document Revised: 02/19/2013 Document Reviewed: 09/13/2012 Elsevier Interactive Patient Education [employer] Elsevier Inc. Hysteroscopy Hysteroscopy is a procedure used for looking inside the womb (uterus). It may be done for various reasons, including:  To evaluate abnormal bleeding, fibroid (benign, noncancerous) tumors, polyps, scar tissue (adhesions), and possibly cancer of the uterus.  To look for lumps (tumors) and other uterine growths.  To look for causes of why a woman cannot get pregnant (infertility), causes of recurrent loss of pregnancy (miscarriages), or a lost intrauterine device (IUD).  To perform a sterilization by blocking the fallopian tubes from inside the uterus. In this procedure, a thin, flexible tube with a tiny light and camera on the end of it (hysteroscope) is used to look inside the uterus. A hysteroscopy should be done right after a menstrual period to be sure you are not pregnant. [facility] KNOW ABOUT:   Any allergies you have.  All medicines you are taking, including vitamins, herbs, eye drops, creams, and over-the-counter medicines.  Previous problems you or members of your family have had with the use of anesthetics.  Any blood  disorders you have.  Previous surgeries you have had.  Medical conditions you have. RISKS AND COMPLICATIONS  Generally, this is a safe procedure. However, as with any procedure, complications can occur. Possible complications include:  Putting a hole in the uterus.  Excessive  bleeding.  Infection.  Damage to the cervix.  Injury to other organs.  Allergic reaction to medicines.  Too much fluid used in the uterus for the procedure. BEFORE THE PROCEDURE   Ask your health care provider about changing or stopping any regular medicines.  Do not take aspirin or blood thinners for 1 week before the procedure, or as directed by your health care provider. These can cause bleeding.  If you smoke, do not smoke for 2 weeks before the procedure.  In some cases, a medicine is placed in the cervix the day before the procedure. This medicine makes the cervix have a larger opening (dilate). This makes it easier for the instrument to be inserted into the uterus during the procedure.  Do not eat or drink anything for at least 8 hours before the surgery.  Arrange for someone to take you home after the procedure. PROCEDURE   You may be given a medicine to relax you (sedative). You may also be given one of the following:  A medicine that numbs the area around the cervix (local anesthetic).  A medicine that makes you sleep through the procedure (general anesthetic).  The hysteroscope is inserted through the vagina into the uterus. The camera on the hysteroscope sends a picture to a TV screen. This gives the surgeon a good view inside the uterus.  During the procedure, air or a liquid is put into the uterus, which allows the surgeon to see better.  Sometimes, tissue is gently scraped from inside the uterus. These tissue samples are sent to a lab for testing. AFTER THE PROCEDURE   If you had a general anesthetic, you may be groggy for a couple hours after the procedure.  If you had a local  anesthetic, you will be able to go home as soon as you are stable and feel ready.  You may have some cramping. This normally lasts for a couple days.  You may have bleeding, which varies from light spotting for a few days to menstrual-like bleeding for 3-7 days. This is normal.  If your test results are not back during the visit, make an appointment with your health care provider to find out the results.   This information is not intended to replace advice given to you by your health care provider. Make sure you discuss any questions you have with your health care provider.   Document Released: 05/23/2000 Document Revised: 12/05/2012 Document Reviewed: 09/13/2012 Elsevier Interactive Patient Education 2016 Elsevier Inc. Hysteroscopy, Care After Refer to this sheet in the next few weeks. These instructions provide you with information on caring for yourself after your procedure. Your health care provider may also give you more specific instructions. Your treatment has been planned according to current medical practices, but problems sometimes occur. Call your health care provider if you have any problems or questions after your procedure.  WHAT TO EXPECT AFTER THE PROCEDURE After your procedure, it is typical to have the following:  You may have some cramping. This normally lasts for a couple days.  You may have bleeding. This can vary from light spotting for a few days to menstrual-like bleeding for 3-7 days. HOME CARE INSTRUCTIONS  Rest for the first 1-2 days after the procedure.  Only take over-the-counter or prescription medicines as directed by your health care provider. Do not take aspirin. It can increase the chances of bleeding.  Take showers instead of baths for 2 weeks or as directed by your health care provider.  Do  not drive for 24 hours or as directed.  Do not drink alcohol while taking pain medicine.  Do not use tampons, douche, or have sexual intercourse for 2 weeks or  until your health care provider says it is okay.  Take your temperature twice a day for 4-5 days. Write it down each time.  Follow your health care provider's advice about diet, exercise, and lifting.  If you develop constipation, you may:  Take a mild laxative if your health care provider approves.  Add bran foods to your diet.  Drink enough fluids to keep your urine clear or pale yellow.  Try to have someone with you or available to you for the first 24-48 hours, especially if you were given a general anesthetic.  Follow up with your health care provider as directed. SEEK MEDICAL CARE IF:  You feel dizzy or lightheaded.  You feel sick to your stomach (nauseous).  You have abnormal vaginal discharge.  You have a rash.  You have pain that is not controlled with medicine. SEEK IMMEDIATE MEDICAL CARE IF:  You have bleeding that is heavier than a normal menstrual period.  You have a fever.  You have increasing cramps or pain, not controlled with medicine.  You have new belly (abdominal) pain.  You pass out.  You have pain in the tops of your shoulders (shoulder strap areas).  You have shortness of breath.   This information is not intended to replace advice given to you by your health care provider. Make sure you discuss any questions you have with your health care provider.   Document Released: 12/05/2012 Document Reviewed: 12/05/2012 Elsevier Interactive Patient Education 2016 Elsevier Inc. General Anesthesia, Adult General anesthesia is a sleep-like state of non-feeling produced by medicines (anesthetics). General anesthesia prevents you from being alert and feeling pain during a medical procedure. Your caregiver may recommend general anesthesia if your procedure:  Is long.  Is painful or uncomfortable.  Would be frightening to see or hear.  Requires you to be still.  Affects your breathing.  Causes significant blood loss. LET YOUR CAREGIVER KNOW  ABOUT:  Allergies to food or medicine.  Medicines taken, including vitamins, herbs, eyedrops, over-the-counter medicines, and creams.  Use of steroids (by mouth or creams).  Previous problems with anesthetics or numbing medicines, including problems experienced by relatives.  History of bleeding problems or blood clots.  Previous surgeries and types of anesthetics received.  Possibility of pregnancy, if this applies.  Use of cigarettes, alcohol, or illegal drugs.  Any health condition(s), especially diabetes, sleep apnea, and high blood pressure. RISKS AND COMPLICATIONS General anesthesia rarely causes complications. However, if complications do occur, they can be life threatening. Complications include:  A lung infection.  A stroke.  A heart attack.  Waking up during the procedure. When this occurs, the patient may be unable to move and communicate that he or she is awake. The patient may feel severe pain. Older adults and adults with serious medical problems are more likely to have complications than adults who are young and healthy. Some complications can be prevented by answering all of your caregiver's questions thoroughly and by following all pre-procedure instructions. It is important to tell your caregiver if any of the pre-procedure instructions, especially those related to diet, were not followed. Any food or liquid in the stomach can cause problems when you are under general anesthesia. BEFORE THE PROCEDURE  Ask your caregiver if you will have to spend the night at the hospital. If  you will not have to spend the night, arrange to have an adult drive you and stay with you for 24 hours.  Follow your caregiver's instructions if you are taking dietary supplements or medicines. Your caregiver may tell you to stop taking them or to reduce your dosage.  Do not smoke for as long as possible before your procedure. If possible, stop smoking 3-6 weeks before the procedure.  Do  not take new dietary supplements or medicines within 1 week of your procedure unless your caregiver approves them.  Do not eat within 8 hours of your procedure or as directed by your caregiver. Drink only clear liquids, such as water, black coffee (without milk or cream), and fruit juices (without pulp).  Do not drink within 3 hours of your procedure or as directed by your caregiver.  You may brush your teeth on the morning of the procedure, but make sure to spit out the toothpaste and water when finished. PROCEDURE  You will receive anesthetics through a mask, through an intravenous (IV) access tube, or through both. A doctor who specializes in anesthesia (anesthesiologist) or a nurse who specializes in anesthesia (nurse anesthetist) or both will stay with you throughout the procedure to make sure you remain unconscious. He or she will also watch your blood pressure, pulse, and oxygen levels to make sure that the anesthetics do not cause any problems. Once you are asleep, a breathing tube or mask may be used to help you breathe. AFTER THE PROCEDURE You will wake up after the procedure is complete. You may be in the room where the procedure was performed or in a recovery area. You may have a sore throat if a breathing tube was used. You may also feel:  Dizzy.  Weak.  Drowsy.  Confused.  Nauseous.  Cold. These are all normal responses and can be expected to last for up to 24 hours after the procedure is complete. A caregiver will tell you when you are ready to go home. This will usually be when you are fully awake and in stable condition.   This information is not intended to replace advice given to you by your health care provider. Make sure you discuss any questions you have with your health care provider.   Document Released: 05/24/2007 Document Revised: 03/07/2014 Document Reviewed: 06/15/2011 Elsevier Interactive Patient Education 2016 Elsevier Inc. General Anesthesia, Adult, Care  After Refer to this sheet in the next few weeks. These instructions provide you with information on caring for yourself after your procedure. Your health care provider may also give you more specific instructions. Your treatment has been planned according to current medical practices, but problems sometimes occur. Call your health care provider if you have any problems or questions after your procedure. WHAT TO EXPECT AFTER THE PROCEDURE After the procedure, it is typical to experience:  Sleepiness.  Nausea and vomiting. HOME CARE INSTRUCTIONS  For the first 24 hours after general anesthesia:  Have a responsible person with you.  Do not drive a car. If you are alone, do not take public transportation.  Do not drink alcohol.  Do not take medicine that has not been prescribed by your health care provider.  Do not sign important papers or make important decisions.  You may resume a normal diet and activities as directed by your health care provider.  Change bandages (dressings) as directed.  If you have questions or problems that seem related to general anesthesia, call the hospital and ask for the anesthetist  or anesthesiologist on call. SEEK MEDICAL CARE IF:  You have nausea and vomiting that continue the day after anesthesia.  You develop a rash. SEEK IMMEDIATE MEDICAL CARE IF:   You have difficulty breathing.  You have chest pain.  You have any allergic problems.   This information is not intended to replace advice given to you by your health care provider. Make sure you discuss any questions you have with your health care provider.   Document Released: 05/23/2000 Document Revised: 03/07/2014 Document Reviewed: 06/15/2011 Elsevier Interactive Patient Education [employer]

## 2015-10-12 ENCOUNTER — Encounter (HOSPITAL_COMMUNITY)
Admission: RE | Admit: 2015-10-12 | Discharge: 2015-10-12 | Disposition: A | Discharge status: Home / Self Care | Payer: Medicaid Other | Source: Ambulatory Visit | Attending: Obstetrics & Gynecology | Admitting: Obstetrics & Gynecology

## 2015-10-12 ENCOUNTER — Encounter (HOSPITAL_COMMUNITY): Payer: Self-pay

## 2015-10-12 DIAGNOSIS — Z01812 Encounter for preprocedural laboratory examination: Secondary | ICD-10-CM | POA: Diagnosis not present

## 2015-10-12 HISTORY — DX: Gastro-esophageal reflux disease without esophagitis: K21.9

## 2015-10-12 LAB — COMPREHENSIVE METABOLIC PANEL
ALBUMIN: 4.3 g/dL (ref 3.5–5.0)
ALK PHOS: 37 U/L — AB (ref 38–126)
ALT: 14 U/L (ref 14–54)
AST: 22 U/L (ref 15–41)
Anion gap: 8 (ref 5–15)
BILIRUBIN TOTAL: 0.4 mg/dL (ref 0.3–1.2)
BUN: 6 mg/dL (ref 6–20)
CALCIUM: 9.1 mg/dL (ref 8.9–10.3)
CO2: 25 mmol/L (ref 22–32)
Chloride: 105 mmol/L (ref 101–111)
Creatinine, Ser: 0.62 mg/dL (ref 0.44–1.00)
GFR calc Af Amer: >60 mL/min (ref >60)
GLUCOSE: 81 mg/dL (ref 65–99)
POTASSIUM: 3.6 mmol/L (ref 3.5–5.1)
Sodium: 138 mmol/L (ref 135–145)
TOTAL PROTEIN: 6.5 g/dL (ref 6.5–8.1)

## 2015-10-12 LAB — URINALYSIS, ROUTINE W REFLEX MICROSCOPIC
BILIRUBIN URINE: NEGATIVE
GLUCOSE, UA: NEGATIVE mg/dL
HGB URINE DIPSTICK: NEGATIVE
KETONES UR: NEGATIVE mg/dL
LEUKOCYTES UA: NEGATIVE
Nitrite: NEGATIVE
PH: 6 (ref 5.0–8.0)
PROTEIN: NEGATIVE mg/dL
Specific Gravity, Urine: <1.005 — ABNORMAL LOW (ref 1.005–1.030)

## 2015-10-12 LAB — CBC
HEMATOCRIT: 35.6 % — AB (ref 36.0–46.0)
HEMOGLOBIN: 12.1 g/dL (ref 12.0–15.0)
MCH: 29.1 pg (ref 26.0–34.0)
MCHC: 34 g/dL (ref 30.0–36.0)
MCV: 85.6 fL (ref 78.0–100.0)
Platelets: 257 10*3/uL (ref 150–400)
RBC: 4.16 MIL/uL (ref 3.87–5.11)
RDW: 14 % (ref 11.5–15.5)
WBC: 9 10*3/uL (ref 4.0–10.5)

## 2015-10-12 LAB — HCG, QUANTITATIVE, PREGNANCY

## 2015-10-12 NOTE — Pre-Procedure Instructions (Signed)
Patient given information to sign up for my chart at home. 

## 2015-10-14 ENCOUNTER — Encounter (HOSPITAL_COMMUNITY): Payer: Self-pay | Admitting: Anesthesiology

## 2015-10-14 ENCOUNTER — Ambulatory Visit (HOSPITAL_COMMUNITY): Payer: Medicaid Other | Admitting: Anesthesiology

## 2015-10-14 ENCOUNTER — Ambulatory Visit (HOSPITAL_COMMUNITY)
Admission: RE | Admit: 2015-10-14 | Discharge: 2015-10-14 | Disposition: A | Discharge status: Home / Self Care | Payer: Medicaid Other | Source: Ambulatory Visit | Attending: Obstetrics & Gynecology | Admitting: Obstetrics & Gynecology

## 2015-10-14 ENCOUNTER — Encounter (HOSPITAL_COMMUNITY)
Admission: RE | Disposition: A | Discharge status: Home / Self Care | Payer: Self-pay | Source: Ambulatory Visit | Attending: Obstetrics & Gynecology

## 2015-10-14 DIAGNOSIS — F329 Major depressive disorder, single episode, unspecified: Secondary | ICD-10-CM | POA: Insufficient documentation

## 2015-10-14 DIAGNOSIS — M5416 Radiculopathy, lumbar region: Secondary | ICD-10-CM | POA: Insufficient documentation

## 2015-10-14 DIAGNOSIS — I10 Essential (primary) hypertension: Secondary | ICD-10-CM | POA: Insufficient documentation

## 2015-10-14 DIAGNOSIS — M549 Dorsalgia, unspecified: Secondary | ICD-10-CM | POA: Insufficient documentation

## 2015-10-14 DIAGNOSIS — I209 Angina pectoris, unspecified: Secondary | ICD-10-CM | POA: Diagnosis not present

## 2015-10-14 DIAGNOSIS — F17210 Nicotine dependence, cigarettes, uncomplicated: Secondary | ICD-10-CM | POA: Diagnosis not present

## 2015-10-14 DIAGNOSIS — N946 Dysmenorrhea, unspecified: Secondary | ICD-10-CM | POA: Insufficient documentation

## 2015-10-14 DIAGNOSIS — M1990 Unspecified osteoarthritis, unspecified site: Secondary | ICD-10-CM | POA: Diagnosis not present

## 2015-10-14 DIAGNOSIS — M419 Scoliosis, unspecified: Secondary | ICD-10-CM | POA: Insufficient documentation

## 2015-10-14 DIAGNOSIS — N920 Excessive and frequent menstruation with regular cycle: Secondary | ICD-10-CM | POA: Diagnosis not present

## 2015-10-14 DIAGNOSIS — G8929 Other chronic pain: Secondary | ICD-10-CM | POA: Insufficient documentation

## 2015-10-14 DIAGNOSIS — N921 Excessive and frequent menstruation with irregular cycle: Secondary | ICD-10-CM | POA: Diagnosis not present

## 2015-10-14 DIAGNOSIS — K219 Gastro-esophageal reflux disease without esophagitis: Secondary | ICD-10-CM | POA: Insufficient documentation

## 2015-10-14 HISTORY — PX: DILITATION & CURRETTAGE/HYSTROSCOPY WITH NOVASURE ABLATION: SHX5568

## 2015-10-14 SURGERY — DILATATION & CURETTAGE/HYSTEROSCOPY WITH NOVASURE ABLATION
Anesthesia: General | Site: Vagina

## 2015-10-14 MED ORDER — KETOROLAC TROMETHAMINE 30 MG/ML IJ SOLN
30.0000 mg | Freq: Once | INTRAMUSCULAR | Status: AC
  Administered 2015-10-14: 30 mg via INTRAVENOUS

## 2015-10-14 MED ORDER — MIDAZOLAM HCL 2 MG/2ML IJ SOLN
INTRAMUSCULAR | Status: AC
  Filled 2015-10-14: qty 2

## 2015-10-14 MED ORDER — KETOROLAC TROMETHAMINE 30 MG/ML IJ SOLN
INTRAMUSCULAR | Status: AC
  Filled 2015-10-14: qty 1

## 2015-10-14 MED ORDER — LACTATED RINGERS IV SOLN
INTRAVENOUS | Status: DC
  Administered 2015-10-14: 11:00:00 via INTRAVENOUS

## 2015-10-14 MED ORDER — LIDOCAINE HCL (PF) 1 % IJ SOLN
INTRAMUSCULAR | Status: AC
  Filled 2015-10-14: qty 5

## 2015-10-14 MED ORDER — PROPOFOL 10 MG/ML IV BOLUS
INTRAVENOUS | Status: DC | PRN
  Administered 2015-10-14: 170 mg via INTRAVENOUS

## 2015-10-14 MED ORDER — FENTANYL CITRATE (PF) 250 MCG/5ML IJ SOLN
INTRAMUSCULAR | Status: AC
  Filled 2015-10-14: qty 5

## 2015-10-14 MED ORDER — CEFAZOLIN SODIUM-DEXTROSE 2-4 GM/100ML-% IV SOLN
2.0000 g | INTRAVENOUS | Status: AC
  Administered 2015-10-14: 2 g via INTRAVENOUS

## 2015-10-14 MED ORDER — HYDROMORPHONE HCL 1 MG/ML IJ SOLN
0.2500 mg | INTRAMUSCULAR | Status: DC | PRN
  Administered 2015-10-14 (×2): 0.5 mg via INTRAVENOUS
  Filled 2015-10-14: qty 1

## 2015-10-14 MED ORDER — MIDAZOLAM HCL 2 MG/2ML IJ SOLN
1.0000 mg | INTRAMUSCULAR | Status: DC | PRN
  Administered 2015-10-14: 2 mg via INTRAVENOUS

## 2015-10-14 MED ORDER — FENTANYL CITRATE (PF) 100 MCG/2ML IJ SOLN
INTRAMUSCULAR | Status: AC
  Filled 2015-10-14: qty 2

## 2015-10-14 MED ORDER — HYDROCODONE-ACETAMINOPHEN 5-325 MG PO TABS: 1.0000 | ORAL_TABLET | Freq: Four times a day (QID) | ORAL | 0 refills | Status: DC | PRN

## 2015-10-14 MED ORDER — ONDANSETRON HCL 4 MG/2ML IJ SOLN
4.0000 mg | Freq: Once | INTRAMUSCULAR | Status: AC
  Administered 2015-10-14: 4 mg via INTRAVENOUS

## 2015-10-14 MED ORDER — KETOROLAC TROMETHAMINE 10 MG PO TABS: 10.0000 mg | ORAL_TABLET | Freq: Three times a day (TID) | ORAL | 0 refills | Status: DC | PRN

## 2015-10-14 MED ORDER — ONDANSETRON HCL 8 MG PO TABS: 8.0000 mg | ORAL_TABLET | Freq: Three times a day (TID) | ORAL | 0 refills | Status: DC | PRN

## 2015-10-14 MED ORDER — FENTANYL CITRATE (PF) 100 MCG/2ML IJ SOLN
INTRAMUSCULAR | Status: DC | PRN
  Administered 2015-10-14 (×3): 50 ug via INTRAVENOUS

## 2015-10-14 MED ORDER — CEFAZOLIN SODIUM-DEXTROSE 2-4 GM/100ML-% IV SOLN
INTRAVENOUS | Status: AC
  Filled 2015-10-14: qty 100

## 2015-10-14 MED ORDER — FENTANYL CITRATE (PF) 100 MCG/2ML IJ SOLN
25.0000 ug | INTRAMUSCULAR | Status: AC | PRN
  Administered 2015-10-14 (×2): 25 ug via INTRAVENOUS

## 2015-10-14 MED ORDER — LIDOCAINE HCL (CARDIAC) 10 MG/ML IV SOLN
INTRAVENOUS | Status: DC | PRN
  Administered 2015-10-14: 40 mg via INTRAVENOUS

## 2015-10-14 MED ORDER — ONDANSETRON HCL 4 MG/2ML IJ SOLN
INTRAMUSCULAR | Status: AC
  Filled 2015-10-14: qty 2

## 2015-10-14 MED ORDER — PROPOFOL 10 MG/ML IV BOLUS
INTRAVENOUS | Status: AC
Start: 2015-10-14 — End: 2015-10-14
  Filled 2015-10-14: qty 20

## 2015-10-14 SURGICAL SUPPLY — 27 items
ABLATOR ENDOMETRIAL BIPOLAR (ABLATOR) ×2 IMPLANT
BAG HAMPER (MISCELLANEOUS) ×2 IMPLANT
CLOTH BEACON ORANGE TIMEOUT ST (SAFETY) ×2 IMPLANT
COVER LIGHT HANDLE STERIS (MISCELLANEOUS) ×4 IMPLANT
FORMALIN 10 PREFIL 120ML (MISCELLANEOUS) ×2 IMPLANT
GAUZE SPONGE 4X4 16PLY XRAY LF (GAUZE/BANDAGES/DRESSINGS) ×2 IMPLANT
GLOVE BIOGEL PI IND STRL 7.0 (GLOVE) ×2 IMPLANT
GLOVE BIOGEL PI IND STRL 8 (GLOVE) ×1 IMPLANT
GLOVE BIOGEL PI INDICATOR 7.0 (GLOVE) ×2
GLOVE BIOGEL PI INDICATOR 8 (GLOVE) ×1
GLOVE ECLIPSE 8.0 STRL XLNG CF (GLOVE) ×2 IMPLANT
GOWN STRL REUS W/TWL LRG LVL3 (GOWN DISPOSABLE) ×2 IMPLANT
GOWN STRL REUS W/TWL XL LVL3 (GOWN DISPOSABLE) ×2 IMPLANT
INST SET HYSTEROSCOPY (KITS) ×2 IMPLANT
IV NS 1000ML (IV SOLUTION) ×1
IV NS 1000ML BAXH (IV SOLUTION) ×1 IMPLANT
KIT ROOM TURNOVER AP CYSTO (KITS) ×2 IMPLANT
MANIFOLD NEPTUNE II (INSTRUMENTS) ×2 IMPLANT
NS IRRIG 1000ML POUR BTL (IV SOLUTION) ×2 IMPLANT
PACK BASIC III (CUSTOM PROCEDURE TRAY) ×1
PACK SRG BSC III STRL LF ECLPS (CUSTOM PROCEDURE TRAY) ×1 IMPLANT
PAD ARMBOARD 7.5X6 YLW CONV (MISCELLANEOUS) ×2 IMPLANT
PAD TELFA 3X4 1S STER (GAUZE/BANDAGES/DRESSINGS) ×2 IMPLANT
SET BASIN LINEN APH (SET/KITS/TRAYS/PACK) ×2 IMPLANT
SET IRRIG Y TYPE TUR BLADDER L (SET/KITS/TRAYS/PACK) ×2 IMPLANT
SHEET LAVH (DRAPES) ×2 IMPLANT
YANKAUER SUCT BULB TIP 10FT TU (MISCELLANEOUS) ×2 IMPLANT

## 2015-10-14 NOTE — Anesthesia Preprocedure Evaluation (Signed)
Anesthesia Evaluation  Patient identified by MRN, date of birth, ID band Patient awake    Reviewed: Allergy & Precautions, NPO status , Patient's Chart, lab work & pertinent test results  Airway Mallampati: II  TM Distance: >3 FB     Dental  (+) Teeth Intact   Pulmonary Current Smoker,    breath sounds clear to auscultation       Cardiovascular hypertension, Pt. on medications (-) angina Rhythm:Regular Rate:Normal     Neuro/Psych PSYCHIATRIC DISORDERS Depression  Neuromuscular disease ( Lumbar radiculopathy, chronic LBP)    GI/Hepatic GERD  Controlled and Medicated,  Endo/Other    Renal/GU      Musculoskeletal   Abdominal   Peds  Hematology   Anesthesia Other Findings   Reproductive/Obstetrics                             Anesthesia Physical Anesthesia Plan  ASA: II  Anesthesia Plan: General   Post-op Pain Management:    Induction: Intravenous  Airway Management Planned: LMA  Additional Equipment:   Intra-op Plan:   Post-operative Plan: Extubation in OR  Informed Consent: I have reviewed the patients History and Physical, chart, labs and discussed the procedure including the risks, benefits and alternatives for the proposed anesthesia with the patient or authorized representative who has indicated his/her understanding and acceptance.     Plan Discussed with:   Anesthesia Plan Comments:         Anesthesia Quick Evaluation

## 2015-10-14 NOTE — Discharge Instructions (Signed)
Endometrial Ablation °Endometrial ablation removes the lining of the uterus (endometrium). It is usually a same-day, outpatient treatment. Ablation helps avoid major surgery, such as surgery to remove the cervix and uterus (hysterectomy). After endometrial ablation, you will have little or no menstrual bleeding and may not be able to have children. However, if you are premenopausal, you will need to use a reliable method of birth control following the procedure because of the small chance that pregnancy can occur. °There are different reasons to have this procedure. These reasons include: °· Heavy periods. °· Bleeding that is causing anemia. °· Irregular bleeding. °· Bleeding fibroids on the lining inside the uterus if they are smaller than 3 centimeters. °This procedure may not be possible for you if:  °· You want to have children in the future.   °· You have severe cramps with your menstrual period.   °· You have precancerous or cancerous cells in your uterus.   °· You were recently pregnant.   °· You have gone through menopause.   °· You have had major surgery on your uterus, resulting in thinning of the uterine wall. Surgeries may include: °¨ The removal of one or more uterine fibroids (myomectomy). °¨ A cesarean section with a classic (vertical) incision on your uterus. Ask your health care provider what type of cesarean you had. Sometimes the scar on your skin is different than the scar on your uterus. °Even if you have had surgery on your uterus, certain types of ablation may still be safe for you. Talk with your health care provider. °LET YOUR HEALTH CARE PROVIDER KNOW ABOUT: °· Any allergies you have. °· All medicines you are taking, including vitamins, herbs, eye drops, creams, and over-the-counter medicines. °· Previous problems you or members of your family have had with the use of anesthetics. °· Any blood disorders you have. °· Previous surgeries you have had. °· Medical conditions you have. °RISKS AND  COMPLICATIONS  °Generally, this is a safe procedure. However, as with any procedure, complications can occur. Possible complications include: °· Perforation of the uterus. °· Bleeding. °· Infection of the uterus, bladder, or vagina. °· Injury to surrounding organs. °· An air bubble to the lung (air embolus). °· Pregnancy following the procedure. °· Failure of the procedure to help the problem, requiring hysterectomy. °· Decreased ability to diagnose cancer in the lining of the uterus. °BEFORE THE PROCEDURE °· The lining of the uterus must be tested to make sure there is no pre-cancerous or cancer cells present. °· An ultrasound may be performed to look at the size of the uterus and to check for abnormalities. °· Medicines may be given to thin the lining of the uterus. °PROCEDURE  °During the procedure, your health care provider will use a tool called a resectoscope to help see inside your uterus. There are different ways to remove the lining of your uterus.  °· Radiofrequency - This method uses a radiofrequency-alternating electric current to remove the lining of the uterus. °· Cryotherapy - This method uses extreme cold to freeze the lining of the uterus. °· Heated-Free Liquid - This method uses heated salt (saline) solution to remove the lining of the uterus. °· Microwave - This method uses high-energy microwaves to heat up the lining of the uterus to remove it. °· Thermal balloon - This method involves inserting a catheter with a balloon tip into the uterus. The balloon tip is filled with heated fluid to remove the lining of the uterus. °AFTER THE PROCEDURE  °After your procedure, do   not have sexual intercourse or insert anything into your vagina until permitted by your health care provider. After the procedure, you may experience: °· Cramps. °· Vaginal discharge. °· Frequent urination. °  °This information is not intended to replace advice given to you by your health care provider. Make sure you discuss any  questions you have with your health care provider. °  °Document Released: 12/25/2003 Document Revised: 11/05/2014 Document Reviewed: 07/18/2012 °Elsevier Interactive Patient Education ©2016 Elsevier Inc ° ° ° °. °PATIENT INSTRUCTIONS °POST-ANESTHESIA ° °IMMEDIATELY FOLLOWING SURGERY:  Do not drive or operate machinery for the first twenty four hours after surgery.  Do not make any important decisions for twenty four hours after surgery or while taking narcotic pain medications or sedatives.  If you develop intractable nausea and vomiting or a severe headache please notify your doctor immediately. ° °FOLLOW-UP:  Please make an appointment with your surgeon as instructed. You do not need to follow up with anesthesia unless specifically instructed to do so. ° °WOUND CARE INSTRUCTIONS (if applicable):  Keep a dry clean dressing on the anesthesia/puncture wound site if there is drainage.  Once the wound has quit draining you may leave it open to air.  Generally you should leave the bandage intact for twenty four hours unless there is drainage.  If the epidural site drains for more than 36-48 hours please call the anesthesia department. ° °QUESTIONS?:  Please feel free to call your physician or the hospital operator if you have any questions, and they will be happy to assist you.    ° ° ° °

## 2015-10-14 NOTE — Anesthesia Procedure Notes (Signed)
Procedure Name: LMA Insertion Performed by: Aune Adami E Pre-anesthesia Checklist: Patient identified, Patient being monitored, Emergency Drugs available, Timeout performed and Suction available Patient Re-evaluated:Patient Re-evaluated prior to inductionOxygen Delivery Method: Circle System Utilized Preoxygenation: Pre-oxygenation with 100% oxygen Intubation Type: IV induction Ventilation: Mask ventilation without difficulty LMA: LMA inserted LMA Size: 4.0 Number of attempts: 1 Placement Confirmation: positive ETCO2 and breath sounds checked- equal and bilateral     

## 2015-10-14 NOTE — Transfer of Care (Signed)
Immediate Anesthesia Transfer of Care Note  Patient: Yvette GaviaSherry Mills  Procedure(s) Performed: Procedure(s) with comments: DILATATION & CURETTAGE/HYSTEROSCOPY WITH NOVASURE ABLATION (N/A) - novasure length 6.5 width 4.5 power 161 time 47 seconds  Patient Location: PACU  Anesthesia Type:General  Level of Consciousness: awake, alert  and oriented  Airway & Oxygen Therapy: Patient Spontanous Breathing and Patient connected to face mask oxygen  Post-op Assessment: Report given to RN  Post vital signs: Reviewed and stable  Last Vitals:  Vitals:   10/14/15 1110 10/14/15 1115  BP:  117/70  Resp: (!) 23 16  Temp:      Last Pain:  Vitals:   10/14/15 1122  PainSc: 5          Complications: No apparent anesthesia complications

## 2015-10-14 NOTE — Op Note (Signed)
Preoperative diagnosis: Menometrorrhagia                                        Dysmenorrhea                                         Postoperative diagnoses: Same as above   Procedure: Hysteroscopy, uterine curettage, endometrial ablation using Novasure  Surgeon: Lazaro ArmsEURE,Aliha Diedrich H   Anesthesia: Laryngeal mask airway  Findings: The endometrium was normal  Description of operation: The patient was taken to the operating room and placed in the supine position. She underwent general anesthesia using the laryngeal mask airway. She was placed in the dorsal lithotomy position and prepped and draped in the usual sterile fashion. A Graves speculum was placed and the anterior cervical lip was grasped with a single-tooth tenaculum. The cervix was dilated serially to allow passage of the hysteroscope. Diagnostic hysteroscopy was performed and was found to be normal. A vigorous uterine curettage was then performed and all tissue sent to pathology for evaluation.  I then proceeded to perform the Novasure endometrial ablation.  The cervical length was 3.0. The uterus sounded to  9.5 cm yielding a net length of 6.5 cm.  The endometrial cavity was 4.5 cm wide. The power was 161 watts.  The total time of therapy was 0 min 47 seconds. The array was evaluated after the procedure and tissue was adherent on all the dimensions of the surface, confirming fundal treatment as well.    All of the equipment worked well throughout the procedure.  The patient was awakened from anesthesia and taken to the recovery room in good stable condition all counts were correct. She received 2 g of Ancef and 30 mg of Toradol preoperatively. She will be discharged from the recovery room and followed up in the office in 1- 2 weeks.  Lazaro ArmsURE,Edger Husain H 10/14/2015 12:49 PM

## 2015-10-14 NOTE — H&P (Signed)
Preoperative History and Physical  Yvette West is a 40 y.o. G5P0 with Patient's last menstrual period was 10/11/2015 (approximate). admitted for a hysteroscopy uterine curettage endometrial ablation using NovaSure.  See previous note Patient presents with  . Follow-up    c/o still bleeding not any better/ nausea. talk options    Blood pressure 120/70, pulse 74, height 5\' 1"  (1.549 m), weight 173 lb (78.5 kg).  40 y.o. G5P0 No LMP recorded. The current method of family planning is megestrol.  Subjective On megace bleeds every day Some days light and brwon, some days heavy red Associated cramps every day, variable, no meds 25% dyspareunia Megace also makes her nauseated   Objective   Pertinent ROS No burning with urination, frequency or urgency No nausea, vomiting or diarrhea Nor fever chills or other constitutional symptoms   Labs or studies Reviewed sonogram 10/2013    Impression Diagnoses this Encounter::   ICD-9-CM ICD-10-CM   1. Menorrhagia with irregular cycle 626.2 N92.1   2. Dysmenorrhea 625.3 N94.6     Established relevant diagnosis(es):     Past Medical History:  Diagnosis Date  . Anginal pain (HCC)   . Arthritis   . Chronic back pain    managed at [facility]  . Depression   . Hypertension   . Lumbar radiculopathy   . Scoliosis   . Scoliosis      Plan/Recommendations: No orders of the defined types were placed in this encounter.   Labs or Scans Ordered: No orders of the defined types were placed in this encounter.   Management:: As before have recommended ablation, pt not interested in hysterectomy as a solution for the dyspareunia as well  Follow up Return in about 3 weeks (around 10/21/2015).  Pt deciding on timing of ablation in the meantime, tentatiely set for 10/14/2015         PMH:    Past Medical History:  Diagnosis Date  . Anginal pain (HCC)   . Arthritis   . Chronic back pain     managed at [facility]  . Depression   . GERD (gastroesophageal reflux disease)   . Hypertension   . Lumbar radiculopathy   . Scoliosis   . Scoliosis     PSH:     Past Surgical History:  Procedure Laterality Date  . BTL    . CESAREAN SECTION     x2  . CYST REMOVAL HAND     left wrist   . HERNIA REPAIR     umbilical  . TUBAL LIGATION      POb/GynH:      OB History    Gravida Para Term Preterm AB Living   5         5   SAB TAB Ectopic Multiple Live Births                  SH:   Social History  Substance Use Topics  . Smoking status: Current Every Day Smoker    Packs/day: 0.25    Years: 20.00    Types: Cigarettes  . Smokeless tobacco: Never Used  . Alcohol use No    FH:    Family History  Problem Relation Age of Onset  . Hypertension Mother   . Cancer Sister     cervical  . Cancer Maternal Grandmother     colon     Allergies:  Allergies  Allergen Reactions  . Lisinopril Cough    Medications:       Current  Facility-Administered Medications:  .  ceFAZolin (ANCEF) IVPB 2g/100 mL premix, 2 g, Intravenous, On Call to OR, Lazaro ArmsLuther H Eure, MD .  lactated ringers infusion, , Intravenous, Continuous, Laurene FootmanLuis Gonzalez, MD, Last Rate: 50 mL/hr at 10/14/15 1122 .  midazolam (VERSED) injection 1-2 mg, 1-2 mg, Intravenous, Q5 min PRN, Laurene FootmanLuis Gonzalez, MD, 2 mg at 10/14/15 1120  Review of Systems:   Review of Systems  Constitutional: Negative for fever, chills, weight loss, malaise/fatigue and diaphoresis.  HENT: Negative for hearing loss, ear pain, nosebleeds, congestion, sore throat, neck pain, tinnitus and ear discharge.   Eyes: Negative for blurred vision, double vision, photophobia, pain, discharge and redness.  Respiratory: Negative for cough, hemoptysis, sputum production, shortness of breath, wheezing and stridor.   Cardiovascular: Negative for chest pain, palpitations, orthopnea, claudication, leg swelling and PND.  Gastrointestinal: Positive for abdominal pain.  Negative for heartburn, nausea, vomiting, diarrhea, constipation, blood in stool and melena.  Genitourinary: Negative for dysuria, urgency, frequency, hematuria and flank pain.  Musculoskeletal: Negative for myalgias, back pain, joint pain and falls.  Skin: Negative for itching and rash.  Neurological: Negative for dizziness, tingling, tremors, sensory change, speech change, focal weakness, seizures, loss of consciousness, weakness and headaches.  Endo/Heme/Allergies: Negative for environmental allergies and polydipsia. Does not bruise/bleed easily.  Psychiatric/Behavioral: Negative for depression, suicidal ideas, hallucinations, memory loss and substance abuse. The patient is not nervous/anxious and does not have insomnia.      PHYSICAL EXAM:  Blood pressure 117/70, temperature 98.5 F (36.9 C), resp. rate 16, last menstrual period 10/11/2015, SpO2 100 %.    Vitals reviewed. Constitutional: She is oriented to person, place, and time. She appears well-developed and well-nourished.  HENT:  Head: Normocephalic and atraumatic.  Right Ear: External ear normal.  Left Ear: External ear normal.  Nose: Nose normal.  Mouth/Throat: Oropharynx is clear and moist.  Eyes: Conjunctivae and EOM are normal. Pupils are equal, round, and reactive to light. Right eye exhibits no discharge. Left eye exhibits no discharge. No scleral icterus.  Neck: Normal range of motion. Neck supple. No tracheal deviation present. No thyromegaly present.  Cardiovascular: Normal rate, regular rhythm, normal heart sounds and intact distal pulses.  Exam reveals no gallop and no friction rub.   No murmur heard. Respiratory: Effort normal and breath sounds normal. No respiratory distress. She has no wheezes. She has no rales. She exhibits no tenderness.  GI: Soft. Bowel sounds are normal. She exhibits no distension and no mass. There is tenderness. There is no rebound and no guarding.  Genitourinary:       Vulva is normal  without lesions Vagina is pink moist without discharge Cervix normal in appearance and pap is normal Uterus is normal size, contour, position, consistency, mobility, non-tender Adnexa is negative with normal sized ovaries by sonogram  Musculoskeletal: Normal range of motion. She exhibits no edema and no tenderness.  Neurological: She is alert and oriented to person, place, and time. She has normal reflexes. She displays normal reflexes. No cranial nerve deficit. She exhibits normal muscle tone. Coordination normal.  Skin: Skin is warm and dry. No rash noted. No erythema. No pallor.  Psychiatric: She has a normal mood and affect. Her behavior is normal. Judgment and thought content normal.    Labs: Results for orders placed or performed during the hospital encounter of 10/12/15 (from the past 336 hour(s))  CBC   Collection Time: 10/12/15  2:15 PM  Result Value Ref Range   WBC 9.0 4.0 - 10.5  K/uL   RBC 4.16 3.87 - 5.11 MIL/uL   Hemoglobin 12.1 12.0 - 15.0 g/dL   HCT 40.935.6 (L) 81.136.0 - 91.446.0 %   MCV 85.6 78.0 - 100.0 fL   MCH 29.1 26.0 - 34.0 pg   MCHC 34.0 30.0 - 36.0 g/dL   RDW 78.214.0 95.611.5 - 21.315.5 %   Platelets 257 150 - 400 K/uL  Comprehensive metabolic panel   Collection Time: 10/12/15  2:15 PM  Result Value Ref Range   Sodium 138 135 - 145 mmol/L   Potassium 3.6 3.5 - 5.1 mmol/L   Chloride 105 101 - 111 mmol/L   CO2 25 22 - 32 mmol/L   Glucose, Bld 81 65 - 99 mg/dL   BUN 6 6 - 20 mg/dL   Creatinine, Ser 0.860.62 0.44 - 1.00 mg/dL   Calcium 9.1 8.9 - 57.810.3 mg/dL   Total Protein 6.5 6.5 - 8.1 g/dL   Albumin 4.3 3.5 - 5.0 g/dL   AST 22 15 - 41 U/L   ALT 14 14 - 54 U/L   Alkaline Phosphatase 37 (L) 38 - 126 U/L   Total Bilirubin 0.4 0.3 - 1.2 mg/dL   GFR calc non Af Amer >60 >60 mL/min   GFR calc Af Amer >60 >60 mL/min   Anion gap 8 5 - 15  hCG, quantitative, pregnancy   Collection Time: 10/12/15  2:15 PM  Result Value Ref Range   hCG, Beta Chain, Quant, S <1 <5 mIU/mL  Urinalysis,  Routine w reflex microscopic ([facility])   Collection Time: 10/12/15  2:15 PM  Result Value Ref Range   Color, Urine YELLOW YELLOW   APPearance CLEAR CLEAR   Specific Gravity, Urine <1.005 (L) 1.005 - 1.030   pH 6.0 5.0 - 8.0   Glucose, UA NEGATIVE NEGATIVE mg/dL   Hgb urine dipstick NEGATIVE NEGATIVE   Bilirubin Urine NEGATIVE NEGATIVE   Ketones, ur NEGATIVE NEGATIVE mg/dL   Protein, ur NEGATIVE NEGATIVE mg/dL   Nitrite NEGATIVE NEGATIVE   Leukocytes, UA NEGATIVE NEGATIVE    EKG: Orders placed or performed during the hospital encounter of 04/06/15  . ED EKG  . ED EKG  . EKG 12-Lead  . EKG 12-Lead  . EKG    Imaging Studies: No results found.    Assessment: Menometrorrhagia Dysmenorrhea Dyspareunia  Patient Active Problem List   Diagnosis Date Noted  . Excessive or frequent menstruation 10/17/2013    Plan: Hysteroscopy uterine curettage endometrial ablation Pt declines hysterectomy for management of her dyspareunia but wants to proceed with ablation to try and control bleeding  EURE,LUTHER H 10/14/2015 11:46 AM

## 2015-10-15 NOTE — Anesthesia Postprocedure Evaluation (Signed)
Anesthesia Post Note  Late Entry for 10-14-2015  Patient: Yvette West  Procedure(s) Performed: Procedure(s) (LRB): DILATATION & CURETTAGE/HYSTEROSCOPY WITH NOVASURE ABLATION (N/A)  Patient location during evaluation: PACU Level of consciousness: awake and alert and oriented Pain management: pain level controlled Vital Signs Assessment: post-procedure vital signs reviewed and stable Respiratory status: spontaneous breathing Cardiovascular status: stable Postop Assessment: no signs of nausea or vomiting Anesthetic complications: no    Last Vitals:  Vitals:   10/14/15 1411 10/14/15 1420  BP: 115/74 119/72  Pulse: 77 80  Resp: 16   Temp: 36.8 C     Last Pain:  Vitals:   10/14/15 1411  TempSrc: Oral  PainSc: 4                  ADAMS, AMY A

## 2015-10-20 ENCOUNTER — Encounter (HOSPITAL_COMMUNITY): Payer: Self-pay | Admitting: Obstetrics & Gynecology

## 2015-10-21 ENCOUNTER — Encounter: Payer: Self-pay | Admitting: Obstetrics & Gynecology

## 2015-10-21 ENCOUNTER — Ambulatory Visit (INDEPENDENT_AMBULATORY_CARE_PROVIDER_SITE_OTHER): Payer: Medicaid Other | Admitting: Obstetrics & Gynecology

## 2015-10-21 VITALS — BP 120/70 | HR 72 | Ht 61.0 in | Wt 176.0 lb

## 2015-10-21 DIAGNOSIS — N921 Excessive and frequent menstruation with irregular cycle: Secondary | ICD-10-CM

## 2015-10-21 DIAGNOSIS — Z9889 Other specified postprocedural states: Secondary | ICD-10-CM

## 2015-10-21 DIAGNOSIS — N946 Dysmenorrhea, unspecified: Secondary | ICD-10-CM

## 2015-10-21 MED ORDER — TRAMADOL HCL 50 MG PO TABS: 50.0000 mg | ORAL_TABLET | Freq: Four times a day (QID) | ORAL | 0 refills | Status: DC | PRN

## 2015-10-21 NOTE — Progress Notes (Signed)
  HPI: Patient returns for routine postoperative follow-up having undergone hysteroscopy D&C endometrial ablation on 10/14/2015.  The patient's immediate postoperative recovery has been unremarkable. Since hospital discharge the patient reports abdominal cramping.   Current Outpatient Prescriptions: Biotin w/ Vitamins C & E (HAIR/SKIN/NAILS PO), Take 1 tablet by mouth daily., Disp: , Rfl:  gabapentin (NEURONTIN) 300 MG capsule, Take 300 mg by mouth 3 (three) times daily., Disp: , Rfl:  hydrochlorothiazide (HYDRODIURIL) 12.5 MG tablet, Take 12.5 mg by mouth daily., Disp: , Rfl:  ketorolac (TORADOL) 10 MG tablet, Take 1 tablet (10 mg total) by mouth every 8 (eight) hours as needed., Disp: 15 tablet, Rfl: 0 LOSARTAN POTASSIUM PO, Take 25 mg by mouth daily., Disp: , Rfl:  Multiple Vitamin (MULTIVITAMIN) tablet, Take 1 tablet by mouth daily., Disp: , Rfl:  ondansetron (ZOFRAN) 8 MG tablet, Take 1 tablet (8 mg total) by mouth every 8 (eight) hours as needed for nausea. (Patient not taking: Reported on 10/21/2015), Disp: 12 tablet, Rfl: 0  No current facility-administered medications for this visit.     Blood pressure 120/70, pulse 72, height 5\' 1"  (1.549 m), weight 176 lb (79.8 kg), last menstrual period 09/09/2015.  Physical Exam: Abdomen soft normal post op visit vagian with watery discharge Uterus slightly tender normal post op  Diagnostic Tests:   Pathology: benign  Impression: S/P hysterscopy uterine curettage endo ablation  Plan: Meds ordered this encounter  Medications  . traMADol (ULTRAM) 50 MG tablet    Sig: Take 1 tablet (50 mg total) by mouth every 6 (six) hours as needed.    Dispense:  20 tablet    Refill:  0     Follow up: 1   years  Lazaro ArmsEURE,Gasper Hopes H, MD

## 2016-01-04 ENCOUNTER — Other Ambulatory Visit (HOSPITAL_COMMUNITY): Payer: Self-pay | Admitting: Emergency Medicine

## 2016-01-04 DIAGNOSIS — Z87442 Personal history of urinary calculi: Secondary | ICD-10-CM

## 2016-01-04 DIAGNOSIS — R109 Unspecified abdominal pain: Secondary | ICD-10-CM

## 2016-01-05 ENCOUNTER — Ambulatory Visit (HOSPITAL_COMMUNITY)
Admission: RE | Admit: 2016-01-05 | Discharge: 2016-01-05 | Disposition: A | Discharge status: Home / Self Care | Payer: Medicaid Other | Source: Ambulatory Visit | Attending: Emergency Medicine | Admitting: Emergency Medicine

## 2016-01-05 ENCOUNTER — Other Ambulatory Visit (HOSPITAL_COMMUNITY): Payer: Self-pay | Admitting: Emergency Medicine

## 2016-01-05 DIAGNOSIS — N200 Calculus of kidney: Secondary | ICD-10-CM | POA: Insufficient documentation

## 2016-01-05 DIAGNOSIS — R109 Unspecified abdominal pain: Secondary | ICD-10-CM | POA: Diagnosis not present

## 2016-01-05 DIAGNOSIS — R935 Abnormal findings on diagnostic imaging of other abdominal regions, including retroperitoneum: Secondary | ICD-10-CM

## 2016-01-05 DIAGNOSIS — Z87442 Personal history of urinary calculi: Secondary | ICD-10-CM

## 2016-01-13 ENCOUNTER — Ambulatory Visit (HOSPITAL_COMMUNITY)
Admission: RE | Admit: 2016-01-13 | Discharge: 2016-01-13 | Disposition: A | Discharge status: Home / Self Care | Payer: Medicaid Other | Source: Ambulatory Visit | Attending: Emergency Medicine | Admitting: Emergency Medicine

## 2016-01-13 ENCOUNTER — Ambulatory Visit (HOSPITAL_COMMUNITY): Admission: RE | Admit: 2016-01-13 | Payer: Medicaid Other | Source: Ambulatory Visit

## 2016-01-13 DIAGNOSIS — R935 Abnormal findings on diagnostic imaging of other abdominal regions, including retroperitoneum: Secondary | ICD-10-CM

## 2016-02-11 ENCOUNTER — Encounter (INDEPENDENT_AMBULATORY_CARE_PROVIDER_SITE_OTHER): Payer: Self-pay | Admitting: Internal Medicine

## 2016-03-10 ENCOUNTER — Ambulatory Visit (INDEPENDENT_AMBULATORY_CARE_PROVIDER_SITE_OTHER): Payer: Medicaid Other | Admitting: Internal Medicine

## 2016-05-31 ENCOUNTER — Emergency Department (HOSPITAL_COMMUNITY): Payer: Medicaid Other

## 2016-05-31 ENCOUNTER — Emergency Department (HOSPITAL_COMMUNITY)
Admission: EM | Admit: 2016-05-31 | Discharge: 2016-05-31 | Disposition: A | Discharge status: Home / Self Care | Payer: Medicaid Other | Attending: Emergency Medicine | Admitting: Emergency Medicine

## 2016-05-31 ENCOUNTER — Encounter (HOSPITAL_COMMUNITY): Payer: Self-pay | Admitting: Emergency Medicine

## 2016-05-31 DIAGNOSIS — Z79899 Other long term (current) drug therapy: Secondary | ICD-10-CM | POA: Diagnosis not present

## 2016-05-31 DIAGNOSIS — F17210 Nicotine dependence, cigarettes, uncomplicated: Secondary | ICD-10-CM | POA: Diagnosis not present

## 2016-05-31 DIAGNOSIS — I10 Essential (primary) hypertension: Secondary | ICD-10-CM | POA: Insufficient documentation

## 2016-05-31 DIAGNOSIS — N390 Urinary tract infection, site not specified: Secondary | ICD-10-CM | POA: Insufficient documentation

## 2016-05-31 DIAGNOSIS — R102 Pelvic and perineal pain: Secondary | ICD-10-CM | POA: Diagnosis present

## 2016-05-31 LAB — CBC WITH DIFFERENTIAL/PLATELET
BASOS PCT: 0 %
Basophils Absolute: 0 10*3/uL (ref 0.0–0.1)
EOS ABS: 0.1 10*3/uL (ref 0.0–0.7)
Eosinophils Relative: 1 %
HCT: 36.4 % (ref 36.0–46.0)
Hemoglobin: 12.2 g/dL (ref 12.0–15.0)
LYMPHS ABS: 3.3 10*3/uL (ref 0.7–4.0)
Lymphocytes Relative: 20 %
MCH: 29.4 pg (ref 26.0–34.0)
MCHC: 33.5 g/dL (ref 30.0–36.0)
MCV: 87.7 fL (ref 78.0–100.0)
MONO ABS: 1.2 10*3/uL — AB (ref 0.1–1.0)
MONOS PCT: 7 %
Neutro Abs: 12 10*3/uL — ABNORMAL HIGH (ref 1.7–7.7)
Neutrophils Relative %: 72 %
Platelets: 245 10*3/uL (ref 150–400)
RBC: 4.15 MIL/uL (ref 3.87–5.11)
RDW: 14.2 % (ref 11.5–15.5)
WBC: 16.7 10*3/uL — ABNORMAL HIGH (ref 4.0–10.5)

## 2016-05-31 LAB — BASIC METABOLIC PANEL
Anion gap: 9 (ref 5–15)
BUN: 7 mg/dL (ref 6–20)
CALCIUM: 9.2 mg/dL (ref 8.9–10.3)
CO2: 28 mmol/L (ref 22–32)
CREATININE: 0.73 mg/dL (ref 0.44–1.00)
Chloride: 100 mmol/L — ABNORMAL LOW (ref 101–111)
GFR calc non Af Amer: >60 mL/min (ref >60)
Glucose, Bld: 107 mg/dL — ABNORMAL HIGH (ref 65–99)
Potassium: 2.9 mmol/L — ABNORMAL LOW (ref 3.5–5.1)
SODIUM: 137 mmol/L (ref 135–145)

## 2016-05-31 LAB — URINALYSIS, ROUTINE W REFLEX MICROSCOPIC
Bilirubin Urine: NEGATIVE
Glucose, UA: NEGATIVE mg/dL
Ketones, ur: NEGATIVE mg/dL
NITRITE: NEGATIVE
PROTEIN: NEGATIVE mg/dL
SPECIFIC GRAVITY, URINE: 1.019 (ref 1.005–1.030)
pH: 6 (ref 5.0–8.0)

## 2016-05-31 LAB — HEPATIC FUNCTION PANEL
ALT: 13 U/L — ABNORMAL LOW (ref 14–54)
AST: 22 U/L (ref 15–41)
Albumin: 4.4 g/dL (ref 3.5–5.0)
Alkaline Phosphatase: 47 U/L (ref 38–126)
BILIRUBIN DIRECT: 0.1 mg/dL (ref 0.1–0.5)
BILIRUBIN INDIRECT: 0.6 mg/dL (ref 0.3–0.9)
BILIRUBIN TOTAL: 0.7 mg/dL (ref 0.3–1.2)
Total Protein: 6.8 g/dL (ref 6.5–8.1)

## 2016-05-31 LAB — WET PREP, GENITAL
CLUE CELLS WET PREP: NONE SEEN
Sperm: NONE SEEN
TRICH WET PREP: NONE SEEN
YEAST WET PREP: NONE SEEN

## 2016-05-31 LAB — LIPASE, BLOOD: LIPASE: 19 U/L (ref 11–51)

## 2016-05-31 LAB — PREGNANCY, URINE: PREG TEST UR: NEGATIVE

## 2016-05-31 MED ORDER — POTASSIUM CHLORIDE CRYS ER 20 MEQ PO TBCR
40.0000 meq | EXTENDED_RELEASE_TABLET | Freq: Once | ORAL | Status: AC
  Administered 2016-05-31: 40 meq via ORAL
  Filled 2016-05-31: qty 2

## 2016-05-31 MED ORDER — MORPHINE SULFATE (PF) 4 MG/ML IV SOLN
4.0000 mg | Freq: Once | INTRAVENOUS | Status: AC
  Administered 2016-05-31: 4 mg via INTRAMUSCULAR
  Filled 2016-05-31: qty 1

## 2016-05-31 MED ORDER — CEFTRIAXONE SODIUM 250 MG IJ SOLR
250.0000 mg | Freq: Once | INTRAMUSCULAR | Status: AC
  Administered 2016-05-31: 250 mg via INTRAMUSCULAR
  Filled 2016-05-31: qty 250

## 2016-05-31 MED ORDER — ACETAMINOPHEN 500 MG PO TABS
1000.0000 mg | ORAL_TABLET | Freq: Once | ORAL | Status: AC
  Administered 2016-05-31: 1000 mg via ORAL
  Filled 2016-05-31: qty 2

## 2016-05-31 MED ORDER — LIDOCAINE HCL (PF) 1 % IJ SOLN
INTRAMUSCULAR | Status: AC
  Administered 2016-05-31: 0.9 mL
  Filled 2016-05-31: qty 5

## 2016-05-31 MED ORDER — CEPHALEXIN 500 MG PO CAPS: 500.0000 mg | ORAL_CAPSULE | Freq: Four times a day (QID) | ORAL | 0 refills | Status: DC

## 2016-05-31 NOTE — ED Notes (Signed)
Pt returned from u/s

## 2016-05-31 NOTE — ED Notes (Signed)
Pt to ct 

## 2016-05-31 NOTE — Discharge Instructions (Signed)
Stop taking cipro and take the new prescription as directed. Take over the counter tylenol, as directed on packaging, as needed for fever or discomfort. Apply moist heat to the area(s) of discomfort, for 15 minutes at a time, several times per day for the next few days.  Do not fall asleep on a heating pack.  Call your regular medical doctor or your OB/GYN doctor tomorrow morning to schedule a follow up appointment this week.  Return to the Emergency Department immediately if worsening.

## 2016-05-31 NOTE — ED Notes (Signed)
Pt ambulatory to waiting room. Pt verbalized understanding discharge instructions. NAD.

## 2016-05-31 NOTE — ED Triage Notes (Signed)
Patient complaining of lower abdominal pain with fever since Saturday. States she was seen by PCP yesterday and diagnosed with UTI. States she is taking antibiotics but "the pain is worse."

## 2016-05-31 NOTE — ED Notes (Signed)
Pelvic cart at bedside. 

## 2016-05-31 NOTE — ED Notes (Signed)
Pt taken for ultrasound.

## 2016-05-31 NOTE — ED Provider Notes (Signed)
[facility] Provider Note   CSN: [id number] Arrival date & time: 05/31/16  1427     History   Chief Complaint Chief Complaint  Patient presents with  . Abdominal Pain    HPI Yvette West is a 41 y.o. female.  HPI Pt was seen at 1710.  Per pt, c/o gradual onset and persistence of constant pelvic "pain" for the past 3 days.  Has been associated with vaginal discharge.  Describes the pelvic pain as "aching."  States she was evaluated at her PMD's office, dx UTI and rx abx (she believes is cipro and flagyl). Pt states the "pain is worse now." Has been associated with "fevers." Denies diarrhea, no N/V, no back pain, no rash, no CP/SOB, no black or blood in stools, no dysuria/hematuria.     Past Medical History:  Diagnosis Date  . Anginal pain (HCC)   . Arthritis   . Chronic back pain    managed at [facility]  . Depression   . GERD (gastroesophageal reflux disease)   . Hypertension   . Lumbar radiculopathy   . Scoliosis   . Scoliosis     Patient Active Problem List   Diagnosis Date Noted  . Excessive or frequent menstruation 10/17/2013    Past Surgical History:  Procedure Laterality Date  . BTL    . CESAREAN SECTION     x2  . CYST REMOVAL HAND     left wrist   . DILITATION & CURRETTAGE/HYSTROSCOPY WITH NOVASURE ABLATION N/A 10/14/2015   Procedure: DILATATION & CURETTAGE/HYSTEROSCOPY WITH NOVASURE ABLATION;  Surgeon: Lazaro Arms, MD;  Location: [facility] ORS;  Service: Gynecology;  Laterality: N/A;  novasure length 6.5 width 4.5 power 161 time 47 seconds  . HERNIA REPAIR     umbilical  . TUBAL LIGATION      OB History    Gravida Para Term Preterm AB Living   5         5   SAB TAB Ectopic Multiple Live Births                   Home Medications    Prior to Admission medications   Medication Sig Start Date End Date Taking? Authorizing Provider  Biotin w/ Vitamins C & E (HAIR/SKIN/NAILS PO) Take 1 tablet by mouth daily.   Yes Historical Provider, MD    ciprofloxacin (CIPRO) 250 MG tablet Take 250 mg by mouth 2 (two) times daily.   Yes Historical Provider, MD  gabapentin (NEURONTIN) 300 MG capsule Take 300 mg by mouth 3 (three) times daily. 02/10/15  Yes Historical Provider, MD  hydrochlorothiazide (HYDRODIURIL) 12.5 MG tablet Take 12.5 mg by mouth daily.   Yes Historical Provider, MD  losartan (COZAAR) 25 MG tablet Take 25 mg by mouth daily.   Yes Historical Provider, MD  metroNIDAZOLE (FLAGYL) 500 MG tablet Take 500 mg by mouth 3 (three) times daily.   Yes Historical Provider, MD  Multiple Vitamin (MULTIVITAMIN) tablet Take 1 tablet by mouth daily.   Yes Historical Provider, MD    Family History Family History  Problem Relation Age of Onset  . Hypertension Mother   . Cancer Sister     cervical  . Cancer Maternal Grandmother     colon    Social History Social History  Substance Use Topics  . Smoking status: Current Every Day Smoker    Packs/day: 0.25    Years: 20.00    Types: Cigarettes  . Smokeless tobacco: Never Used  . Alcohol  use No     Allergies   Lisinopril   Review of Systems Review of Systems ROS: Statement: All systems negative except as marked or noted in the HPI; Constitutional: +fever and chills. ; ; Eyes: Negative for eye pain, redness and discharge. ; ; ENMT: Negative for ear pain, hoarseness, nasal congestion, sinus pressure and sore throat. ; ; Cardiovascular: Negative for chest pain, palpitations, diaphoresis, dyspnea and peripheral edema. ; ; Respiratory: Negative for cough, wheezing and stridor. ; ; Gastrointestinal: Negative for nausea, vomiting, diarrhea, abdominal pain, blood in stool, hematemesis, jaundice and rectal bleeding. . ; ; Genitourinary: Negative for dysuria, flank pain and hematuria. ; ; GYN:  +pelvic pain, no vaginal bleeding,+vaginal discharge, no vulvar pain.  Musculoskeletal: Negative for back pain and neck pain. Negative for swelling and trauma.; ; Skin: Negative for pruritus, rash,  abrasions, blisters, bruising and skin lesion.; ; Neuro: Negative for headache, lightheadedness and neck stiffness. Negative for weakness, altered level of consciousness, altered mental status, extremity weakness, paresthesias, involuntary movement, seizure and syncope.       Physical Exam Updated Vital Signs BP (!) 152/88 (BP Location: Left Arm)   Pulse (!) 109   Temp (!) 100.6 F (38.1 C) (Oral)   Resp 18   Ht  (1.575 m)   Wt 170 lb (77.1 kg)   LMP 05/24/2016   SpO2 100%   BMI 31.09 kg/m   Physical Exam 1715: Physical examination:  Nursing notes reviewed; Vital signs and O2 SAT reviewed;  Constitutional: Well developed, Well nourished, Well hydrated, In no acute distress; Head:  Normocephalic, atraumatic; Eyes: EOMI, PERRL, No scleral icterus; ENMT: Mouth and pharynx normal, Mucous membranes moist; Neck: Supple, Full range of motion, No lymphadenopathy; Cardiovascular: Regular rate and rhythm, No gallop; Respiratory: Breath sounds clear & equal bilaterally, No wheezes.  Speaking full sentences with ease, Normal respiratory effort/excursion; Chest: Nontender, Movement normal; Abdomen: Soft, +pelvic tenderness to palp. No rebound or guarding. Nondistended, Normal bowel sounds; Genitourinary: No CVA tenderness. Pelvic exam performed with permission of pt and female ED tech assist during exam.  External genitalia w/o lesions. Vaginal vault without discharge.  Cervix w/o lesions, not friable, GC/chlam and wet prep obtained and sent to lab.  Bimanual exam w/o CMT or adnexal tenderness. +suprapubic tenderness..;;; Extremities: Pulses normal, No tenderness, No edema, No calf edema or asymmetry.; Neuro: AA&Ox3, Major CN grossly intact.  Speech clear. No gross focal motor or sensory deficits in extremities. Climbs on and off stretcher easily by herself. Gait steady.; Skin: Color normal, Warm, Dry.   ED Treatments / Results  Labs (all labs ordered are listed, but only abnormal results are  displayed)   EKG  EKG Interpretation None       Radiology   Procedures Procedures (including critical care time)  Medications Ordered in ED Medications  acetaminophen (TYLENOL) tablet 1,000 mg (1,000 mg Oral Given 05/31/16 1448)     Initial Impression / Assessment and Plan / ED Course  I have reviewed the triage vital signs and the nursing notes.  Pertinent labs & imaging results that were available during my care of the patient were reviewed by me and considered in my medical decision making (see chart for details).  MDM Reviewed: previous chart, nursing note and vitals Reviewed previous: labs Interpretation: labs and ultrasound   Results for orders placed or performed during the hospital encounter of 05/31/16  Wet prep, genital  Result Value Ref Range   Yeast Wet Prep HPF POC NONE SEEN NONE  SEEN   Trich, Wet Prep NONE SEEN NONE SEEN   Clue Cells Wet Prep HPF POC NONE SEEN NONE SEEN   WBC, Wet Prep HPF POC MANY (A) NONE SEEN   Sperm NONE SEEN   CBC with Differential  Result Value Ref Range   WBC 16.7 (H) 4.0 - 10.5 K/uL   RBC 4.15 3.87 - 5.11 MIL/uL   Hemoglobin 12.2 12.0 - 15.0 g/dL   HCT 40.9 81.1 - 91.4 %   MCV 87.7 78.0 - 100.0 fL   MCH 29.4 26.0 - 34.0 pg   MCHC 33.5 30.0 - 36.0 g/dL   RDW 78.2 95.6 - 21.3 %   Platelets 245 150 - 400 K/uL   Neutrophils Relative % 72 %   Neutro Abs 12.0 (H) 1.7 - 7.7 K/uL   Lymphocytes Relative 20 %   Lymphs Abs 3.3 0.7 - 4.0 K/uL   Monocytes Relative 7 %   Monocytes Absolute 1.2 (H) 0.1 - 1.0 K/uL   Eosinophils Relative 1 %   Eosinophils Absolute 0.1 0.0 - 0.7 K/uL   Basophils Relative 0 %   Basophils Absolute 0.0 0.0 - 0.1 K/uL  Basic metabolic panel  Result Value Ref Range   Sodium 137 135 - 145 mmol/L   Potassium 2.9 (L) 3.5 - 5.1 mmol/L   Chloride 100 (L) 101 - 111 mmol/L   CO2 28 22 - 32 mmol/L   Glucose, Bld 107 (H) 65 - 99 mg/dL   BUN 7 6 - 20 mg/dL   Creatinine, Ser 0.86 0.44 - 1.00 mg/dL   Calcium  9.2 8.9 - 57.8 mg/dL   GFR calc non Af Amer >60 >60 mL/min   GFR calc Af Amer >60 >60 mL/min   Anion gap 9 5 - 15  Urinalysis, Routine w reflex microscopic  Result Value Ref Range   Color, Urine YELLOW YELLOW   APPearance CLEAR CLEAR   Specific Gravity, Urine 1.019 1.005 - 1.030   pH 6.0 5.0 - 8.0   Glucose, UA NEGATIVE NEGATIVE mg/dL   Hgb urine dipstick LARGE (A) NEGATIVE   Bilirubin Urine NEGATIVE NEGATIVE   Ketones, ur NEGATIVE NEGATIVE mg/dL   Protein, ur NEGATIVE NEGATIVE mg/dL   Nitrite NEGATIVE NEGATIVE   Leukocytes, UA MODERATE (A) NEGATIVE   RBC / HPF TOO NUMEROUS TO COUNT 0 - 5 RBC/hpf   WBC, UA 6-30 0 - 5 WBC/hpf   Bacteria, UA RARE (A) NONE SEEN   Squamous Epithelial / LPF 0-5 (A) NONE SEEN   Mucous PRESENT   Lipase, blood  Result Value Ref Range   Lipase 19 11 - 51 U/L  Hepatic function panel  Result Value Ref Range   Total Protein 6.8 6.5 - 8.1 g/dL   Albumin 4.4 3.5 - 5.0 g/dL   AST 22 15 - 41 U/L   ALT 13 (L) 14 - 54 U/L   Alkaline Phosphatase 47 38 - 126 U/L   Total Bilirubin 0.7 0.3 - 1.2 mg/dL   Bilirubin, Direct 0.1 0.1 - 0.5 mg/dL   Indirect Bilirubin 0.6 0.3 - 0.9 mg/dL  Pregnancy, urine  Result Value Ref Range   Preg Test, Ur NEGATIVE NEGATIVE   US Transvaginal Non-ob Result Date: 05/31/2016 CLINICAL DATA:  Pelvic pain with fever worsening since Saturday, worsening this morning. Currently having vaginal bleeding. History of D & C, tubal ligation and recent urinary tract infections. Currently on antibiotics. EXAM: TRANSABDOMINAL AND TRANSVAGINAL ULTRASOUND OF PELVIS DOPPLER ULTRASOUND OF OVARIES TECHNIQUE: Both transabdominal and  transvaginal ultrasound examinations of the pelvis were performed. Transabdominal technique was performed for global imaging of the pelvis including uterus, ovaries, adnexal regions, and pelvic cul-de-sac. It was necessary to proceed with endovaginal exam following the transabdominal exam to visualize the endometrium and ovaries.  Color and duplex Doppler ultrasound was utilized to evaluate blood flow to the ovaries. COMPARISON:  11/18/2013 pelvic ultrasound, CT from 01/05/2016 FINDINGS: Uterus Measurements: 10.5 x 4.8 x 7.1. No fibroids or other mass visualized. Endometrium Thickness: 9.4 mm with trace heterogeneous fluid and endometrial debris noted within possibly representing blood products. Right ovary Measurements: 3.1 x 2.2 x 2.1 cm. Normal appearance/no adnexal mass. Left ovary Not visualized and obscured by bowel. Pulsed Doppler evaluation of the visualized right ovary demonstrates normal low-resistance arterial and venous waveforms. Other findings No abnormal free fluid. IMPRESSION: 1. No torsion of the right ovary. The left ovary was not visualized due to bowel. No adnexal mass is seen on the left however. 2. Small amount of fluid and hypoechoic debris noted in the endometrial cavity which may reflect blood products of menses. Electronically Signed   By: Tollie Eth M.D.   On: 05/31/2016 19:28   Ct Renal Stone Study Result Date: 05/31/2016 CLINICAL DATA:  Left flank pain and hematuria EXAM: CT ABDOMEN AND PELVIS WITHOUT CONTRAST TECHNIQUE: Multidetector CT imaging of the abdomen and pelvis was performed following the standard protocol without oral or intravenous contrast material administration. COMPARISON:  January 05, 2016 FINDINGS: Lower chest: The lung bases are clear. Hepatobiliary: No focal liver lesions are evident on this noncontrast enhanced study. No gallbladder wall thickening. No appreciable biliary duct dilatation. Pancreas: There is no appreciable pancreatic mass or inflammatory focus. Spleen: No splenic lesions are evident. Adrenals/Urinary Tract: Adrenals appear unremarkable bilaterally. Kidneys bilaterally show no appreciable mass or hydronephrosis on either side. There is again noted a calculus in the posterior mid right kidney measuring 1.3 x 1.0 cm in size. No new renal calculi evident. There is no ureteral  calculus on either side. Urinary bladder is midline with slight generalized thickening of the wall of the urinary bladder. Stomach/Bowel: There is no appreciable bowel wall or mesenteric thickening. There is no evident bowel obstruction. No free air or portal venous air is appreciable. Vascular/Lymphatic: The aorta is somewhat ectatic but nonaneurysmal. There are no vascular lesions evident on this noncontrast enhanced study beyond slight calcification in the distal aorta near the bifurcation. No adenopathy is appreciable in the abdomen or pelvis. Reproductive: The uterus is anteverted. The uterus appears enlarged and mildly lobulated in contour. Suspect underlying leiomyomatous change. There is a cystic area arising in the left adnexal region measuring 3.0 x 2.0 cm, a likely simple cyst versus dominant follicle arising from left ovary. No other pelvic mass identified. Other: Appendix appears unremarkable. There is no ascites or abscess in the abdomen or pelvis. Musculoskeletal: There is marked levoscoliosis with rotatory component. There are multiple foci of degenerative change in the lumbar spine. No blastic or lytic bone lesions. No intramuscular or abdominal wall lesion evident. IMPRESSION: Stable nonobstructing calculus posterior left kidney measuring 1.3 x 1.0 cm. No new renal calculi. No hydronephrosis or ureteral calculus on either side. Mild urinary bladder wall thickening, a finding concerning for a degree of cystitis. Prominent uterus with questionable leiomyomatous change. Probable cyst or dominant follicle arising from the left adnexa. Given these findings, correlation with pelvic ultrasound non emergently may be reasonable. Marked lumbar scoliosis with degenerative type change. Electronically Signed   By: Chrissie Noa  Margarita Grizzle III M.D.   On: 05/31/2016 19:36    2025:  Will d/c cipro and start keflex after IM rocephin dose while in the ED. UC is pending. Potassium repleted PO. Pt has tol PO well while in  the ED without N/V. Workup is otherwise reassuring. Dx and testing d/w pt.  Questions answered.  Verb understanding, agreeable to d/c home with outpt f/u.    Final Clinical Impressions(s) / ED Diagnoses   Final diagnoses:  Pelvic pain    New Prescriptions New Prescriptions   No medications on file     Samuel Jester, DO 06/02/16 2137

## 2016-06-02 LAB — URINE CULTURE: CULTURE: NO GROWTH

## 2016-06-02 LAB — GC/CHLAMYDIA PROBE AMP (~~LOC~~) NOT AT ARMC
CHLAMYDIA, DNA PROBE: NEGATIVE
NEISSERIA GONORRHEA: NEGATIVE

## 2016-06-07 ENCOUNTER — Ambulatory Visit: Payer: Medicaid Other | Admitting: Obstetrics & Gynecology

## 2016-06-09 ENCOUNTER — Ambulatory Visit: Payer: Medicaid Other | Admitting: Obstetrics and Gynecology

## 2016-06-14 ENCOUNTER — Ambulatory Visit: Payer: Medicaid Other | Admitting: Obstetrics & Gynecology

## 2016-07-04 ENCOUNTER — Ambulatory Visit (INDEPENDENT_AMBULATORY_CARE_PROVIDER_SITE_OTHER): Payer: Medicaid Other | Admitting: Obstetrics & Gynecology

## 2016-07-04 ENCOUNTER — Encounter: Payer: Self-pay | Admitting: Obstetrics & Gynecology

## 2016-07-04 VITALS — BP 120/78 | HR 73 | Ht 62.0 in | Wt 168.5 lb

## 2016-07-04 DIAGNOSIS — N946 Dysmenorrhea, unspecified: Secondary | ICD-10-CM

## 2016-07-04 DIAGNOSIS — R1031 Right lower quadrant pain: Secondary | ICD-10-CM | POA: Diagnosis not present

## 2016-07-04 DIAGNOSIS — G8929 Other chronic pain: Secondary | ICD-10-CM

## 2016-07-04 NOTE — Progress Notes (Signed)
Chief Complaint  Patient presents with  . pain in ovary area    went to [facility] 2 weeks ago wth pain    Blood pressure 120/78, pulse 73, height 5\' 2"  (1.575 m), weight 168 lb 8 oz (76.4 kg), last menstrual period 06/11/2016.  41 y.o. [id number] Patient's last menstrual period was 06/11/2016 (exact date). The current method of family planning is tubal ligation.  Outpatient Encounter Prescriptions as of 07/04/2016  Medication Sig Note  . Biotin w/ Vitamins C & E (HAIR/SKIN/NAILS PO) Take 1 tablet by mouth daily.   Marland Kitchen. gabapentin (NEURONTIN) 300 MG capsule Take 300 mg by mouth 3 (three) times daily. 04/06/2015: [facility] Received Sig: Take by mouth.  . hydrochlorothiazide (HYDRODIURIL) 12.5 MG tablet Take 12.5 mg by mouth daily.   Marland Kitchen. losartan (COZAAR) 25 MG tablet Take 25 mg by mouth daily.   . Multiple Vitamin (MULTIVITAMIN) tablet Take 1 tablet by mouth daily.   . [DISCONTINUED] cephALEXin (KEFLEX) 500 MG capsule Take 1 capsule (500 mg total) by mouth 4 (four) times daily.   . [DISCONTINUED] metroNIDAZOLE (FLAGYL) 500 MG tablet Take 500 mg by mouth 3 (three) times daily. 05/31/2016: 7 day course starting on 05/30/2016   No facility-administered encounter medications on file as of 07/04/2016.     Subjective Pt was seen in ED Note reviewed Since then her pain is resolved on the right  Objective General WDWN female NAD Vulva:  normal appearing vulva with no masses, tenderness or lesions Vagina:  normal mucosa, no discharge Cervix:  no cervical motion tenderness and no lesions Uterus:  normal size, contour, position, consistency, mobility, non-tender Adnexa: ovaries:present,  normal adnexa in size, nontender and no masses    Pertinent ROS No burning with urination, frequency or urgency No nausea, vomiting or diarrhea Nor fever chills or other constitutional symptoms   Labs or studies Reviewed ED visit scans and  notes    Impression Diagnoses this Encounter::   ICD-9-CM ICD-10-CM   1. Dysmenorrhea 625.3 N94.6   2. Chronic RLQ pain 789.03 R10.31    338.29 G89.29     Established relevant diagnosis(es):   Plan/Recommendations: No orders of the defined types were placed in this encounter.   Labs or Scans Ordered: No orders of the defined types were placed in this encounter.   Management:: No change in overall status pt aware hysterectomy is the solution for her dyspareunia  Follow up Return if symptoms worsen or fail to improve, for Follow up, with Dr Despina HiddenEure.           All questions were answered.  Past Medical History:  Diagnosis Date  . Anginal pain (HCC)   . Arthritis   . Chronic back pain    [facility]  . Depression   . Endometriosis   . GERD (gastroesophageal reflux disease)   . Hypertension   . Lumbar radiculopathy   . Pinched nerve   . Scoliosis   . Scoliosis     Past Surgical History:  Procedure Laterality Date  . BTL    . CESAREAN SECTION     x2  . CYST REMOVAL HAND     left wrist   . DILITATION & CURRETTAGE/HYSTROSCOPY WITH NOVASURE ABLATION N/A 10/14/2015   Procedure: DILATATION & CURETTAGE/HYSTEROSCOPY WITH NOVASURE ABLATION;  Surgeon: Lazaro ArmsLuther H Eure, MD;  Location: [facility];  Service: Gynecology;  Laterality: N/A;  novasure length 6.5 width 4.5 power 161 time 47 seconds  .  HERNIA REPAIR     umbilical  . TUBAL LIGATION      OB History    Gravida Para Term Preterm AB Living   5 5 5     5    SAB TAB Ectopic Multiple Live Births           5      Allergies  Allergen Reactions  . Lisinopril Cough    Social History   Social History  . Marital status: Single    Spouse name: N/A  . Number of children: N/A  . Years of education: N/A   Social History Main Topics  . Smoking status: Current Every Day Smoker    Packs/day: 0.25    Years: 20.00    Types: Cigarettes  . Smokeless tobacco: Never Used  . Alcohol use No  . Drug use: No  .  Sexual activity: Yes    Birth control/ protection: Surgical     Comment: tubal and ablation   Other Topics Concern  . None   Social History Narrative  . None    Family History  Problem Relation Age of Onset  . Hypertension Mother   . Cancer Sister     cervical  . Colon cancer Paternal Grandmother   . Cancer Maternal Uncle     lung

## 2016-09-28 HISTORY — PX: SPINAL FUSION: SHX223

## 2016-11-02 ENCOUNTER — Encounter (HOSPITAL_COMMUNITY): Payer: Self-pay

## 2016-11-02 ENCOUNTER — Emergency Department (HOSPITAL_COMMUNITY): Payer: Medicaid Other

## 2016-11-02 ENCOUNTER — Emergency Department (HOSPITAL_COMMUNITY)
Admission: EM | Admit: 2016-11-02 | Discharge: 2016-11-02 | Disposition: A | Discharge status: Home / Self Care | Payer: Medicaid Other | Attending: Emergency Medicine | Admitting: Emergency Medicine

## 2016-11-02 DIAGNOSIS — I10 Essential (primary) hypertension: Secondary | ICD-10-CM | POA: Insufficient documentation

## 2016-11-02 DIAGNOSIS — R109 Unspecified abdominal pain: Secondary | ICD-10-CM | POA: Insufficient documentation

## 2016-11-02 DIAGNOSIS — F17210 Nicotine dependence, cigarettes, uncomplicated: Secondary | ICD-10-CM | POA: Insufficient documentation

## 2016-11-02 LAB — URINALYSIS, ROUTINE W REFLEX MICROSCOPIC
BILIRUBIN URINE: NEGATIVE
Glucose, UA: NEGATIVE mg/dL
Hgb urine dipstick: NEGATIVE
Ketones, ur: NEGATIVE mg/dL
Leukocytes, UA: NEGATIVE
NITRITE: NEGATIVE
Protein, ur: NEGATIVE mg/dL
Specific Gravity, Urine: 1.006 (ref 1.005–1.030)
pH: 7 (ref 5.0–8.0)

## 2016-11-02 LAB — COMPREHENSIVE METABOLIC PANEL
ALBUMIN: 3.6 g/dL (ref 3.5–5.0)
ALK PHOS: 90 U/L (ref 38–126)
ALT: 53 U/L (ref 14–54)
ANION GAP: 11 (ref 5–15)
AST: 43 U/L — ABNORMAL HIGH (ref 15–41)
BUN: <5 mg/dL — ABNORMAL LOW (ref 6–20)
CALCIUM: 8.9 mg/dL (ref 8.9–10.3)
CO2: 29 mmol/L (ref 22–32)
CREATININE: 0.57 mg/dL (ref 0.44–1.00)
Chloride: 95 mmol/L — ABNORMAL LOW (ref 101–111)
GFR calc Af Amer: >60 mL/min (ref >60)
GFR calc non Af Amer: >60 mL/min (ref >60)
GLUCOSE: 100 mg/dL — AB (ref 65–99)
Potassium: 3.2 mmol/L — ABNORMAL LOW (ref 3.5–5.1)
SODIUM: 135 mmol/L (ref 135–145)
Total Bilirubin: 0.4 mg/dL (ref 0.3–1.2)
Total Protein: 7 g/dL (ref 6.5–8.1)

## 2016-11-02 LAB — CBC WITH DIFFERENTIAL/PLATELET
BASOS ABS: 0 10*3/uL (ref 0.0–0.1)
Basophils Relative: 0 %
EOS ABS: 0.2 10*3/uL (ref 0.0–0.7)
Eosinophils Relative: 2 %
HCT: 31.9 % — ABNORMAL LOW (ref 36.0–46.0)
HEMOGLOBIN: 10.8 g/dL — AB (ref 12.0–15.0)
Lymphocytes Relative: 24 %
Lymphs Abs: 3.3 10*3/uL (ref 0.7–4.0)
MCH: 30.3 pg (ref 26.0–34.0)
MCHC: 33.9 g/dL (ref 30.0–36.0)
MCV: 89.4 fL (ref 78.0–100.0)
Monocytes Absolute: 1.1 10*3/uL — ABNORMAL HIGH (ref 0.1–1.0)
Monocytes Relative: 8 %
NEUTROS PCT: 66 %
Neutro Abs: 8.8 10*3/uL — ABNORMAL HIGH (ref 1.7–7.7)
Platelets: 444 10*3/uL — ABNORMAL HIGH (ref 150–400)
RBC: 3.57 MIL/uL — AB (ref 3.87–5.11)
RDW: 13.6 % (ref 11.5–15.5)
WBC: 13.3 10*3/uL — AB (ref 4.0–10.5)

## 2016-11-02 LAB — LACTIC ACID, PLASMA
LACTIC ACID, VENOUS: 1.7 mmol/L (ref 0.5–1.9)
Lactic Acid, Venous: 1.3 mmol/L (ref 0.5–1.9)

## 2016-11-02 LAB — LIPASE, BLOOD: Lipase: 18 U/L (ref 11–51)

## 2016-11-02 MED ORDER — SODIUM CHLORIDE 0.9 % IV BOLUS (SEPSIS)
1000.0000 mL | Freq: Once | INTRAVENOUS | Status: AC
  Administered 2016-11-02: 1000 mL via INTRAVENOUS

## 2016-11-02 MED ORDER — HYDROMORPHONE HCL 1 MG/ML IJ SOLN
0.5000 mg | Freq: Once | INTRAMUSCULAR | Status: AC
  Administered 2016-11-02: 0.5 mg via INTRAVENOUS
  Filled 2016-11-02: qty 1

## 2016-11-02 MED ORDER — IOPAMIDOL (ISOVUE-300) INJECTION 61%
100.0000 mL | Freq: Once | INTRAVENOUS | Status: AC | PRN
Start: 2016-11-02 — End: 2016-11-02
  Administered 2016-11-02: 100 mL via INTRAVENOUS

## 2016-11-02 MED ORDER — DIPHENHYDRAMINE HCL 25 MG PO CAPS: 25.0000 mg | ORAL_CAPSULE | Freq: Three times a day (TID) | ORAL | 0 refills | Status: DC | PRN

## 2016-11-02 MED ORDER — HYDROMORPHONE HCL 1 MG/ML IJ SOLN
1.0000 mg | Freq: Once | INTRAMUSCULAR | Status: AC
  Administered 2016-11-02: 1 mg via INTRAVENOUS
  Filled 2016-11-02: qty 1

## 2016-11-02 MED ORDER — PIPERACILLIN-TAZOBACTAM 3.375 G IVPB 30 MIN
3.3750 g | Freq: Once | INTRAVENOUS | Status: AC
  Administered 2016-11-02: 3.375 g via INTRAVENOUS
  Filled 2016-11-02: qty 50

## 2016-11-02 MED ORDER — IOPAMIDOL (ISOVUE-300) INJECTION 61%
INTRAVENOUS | Status: AC
  Administered 2016-11-02: 30 mL
  Filled 2016-11-02: qty 30

## 2016-11-02 NOTE — ED Triage Notes (Signed)
Pt reports [facility].  Reports they went through an old c section scar to do the surgery.  C/O abd pain and bloating x 2 days.  Reports has had 1 BM since Surgery.  Denies any n/v.

## 2016-11-02 NOTE — ED Provider Notes (Signed)
Emergency Department Provider Note   I have reviewed the triage vital signs and the nursing notes.   HISTORY  Chief Complaint Abdominal Pain   HPI Yvette West is a 41 y.o. female who recently had a spinal fusion at [facility] that included a general surgery component that utilized and anterior approach and has been having pain at that site and in her right abdomen for the last 3-4 days associated with nausea but no vomiting. Also with decreased BM. No fever, chest pain or pain elsewhere. She hs not noticed any redness or other associated symptoms. Pain made worse by moving too much and seems to improve with rest .   Past Medical History:  Diagnosis Date  . Anginal pain (HCC)   . Arthritis   . Chronic back pain    managed at [facility]  . Depression   . Endometriosis   . GERD (gastroesophageal reflux disease)   . Hypertension   . Lumbar radiculopathy   . Pinched nerve   . Scoliosis   . Scoliosis     Patient Active Problem List   Diagnosis Date Noted  . Excessive or frequent menstruation 10/17/2013    Past Surgical History:  Procedure Laterality Date  . BTL    . CESAREAN SECTION     x2  . CYST REMOVAL HAND     left wrist   . DILITATION & CURRETTAGE/HYSTROSCOPY WITH NOVASURE ABLATION N/A 10/14/2015   Procedure: DILATATION & CURETTAGE/HYSTEROSCOPY WITH NOVASURE ABLATION;  Surgeon: Lazaro Arms, MD;  Location: [facility];  Service: Gynecology;  Laterality: N/A;  novasure length 6.5 width 4.5 power 161 time 47 seconds  . HERNIA REPAIR     umbilical  . TUBAL LIGATION      Current Outpatient Rx  . Order #: [id number] Class: Historical Med  . Order #: [id number] Class: Historical Med  . Order #: [id number] Class: Historical Med  . Order #: [id number] Class: Historical Med  . Order #: [id number] Class: Historical Med  . Order #: [id number] Class: Historical Med  . Order #: [id number] Class: Historical Med  . Order #: [id number] Class: Print    Allergies Lisinopril  Family History    Problem Relation Age of Onset  . Hypertension Mother   . Cancer Sister        cervical  . Colon cancer Paternal Grandmother   . Cancer Maternal Uncle        lung    Social History Social History  Substance Use Topics  . Smoking status: Current Every Day Smoker    Packs/day: 0.25    Years: 20.00    Types: Cigarettes  . Smokeless tobacco: Never Used  . Alcohol use No    Review of Systems  All other systems negative except as documented in the HPI. All pertinent positives and negatives as reviewed in the HPI. ____________________________________________  PHYSICAL EXAM:  VITAL SIGNS: ED Triage Vitals  Enc Vitals Group     BP 11/02/16 1800 (!) 136/93     Pulse Rate 11/02/16 1800 (!) 109     Resp 11/02/16 1800 18     Temp 11/02/16 1800 100 F (37.8 C)     Temp Source 11/02/16 1800 Oral     SpO2 11/02/16 1800 99 %     Weight 11/02/16 1801 166 lb (75.3 kg)     Height 11/02/16 1801 5\' 3"  (1.6 m)     Head Circumference --      Peak Flow --      Pain Score 11/02/16 1801 10  Pain Loc --      Pain Edu? --      Excl. in GC? --     Constitutional: Alert and oriented. Well appearing and in no acute distress. Eyes: Conjunctivae are normal. PERRL. EOMI. Head: Atraumatic. Nose: No congestion/rhinnorhea. Mouth/Throat: Mucous membranes are moist.  Oropharynx non-erythematous. Neck: No stridor.  No meningeal signs.   Cardiovascular: Normal rate, regular rhythm. Good peripheral circulation. Grossly normal heart sounds.   Respiratory: Normal respiratory effort.  No retractions. Lungs CTAB. Gastrointestinal: Soft and nontender. No distention.  Musculoskeletal: No lower extremity tenderness nor edema. No gross deformities of extremities. Neurologic:  Normal speech and language. No gross focal neurologic deficits are appreciated.  Skin:  Skin is warm, dry and intact. Wound in R lower abdomen c/d/i without obvious infection.  Wound on midline back is also c/d/i without obvious  infection. No rash noted.  ____________________________________________   LABS (all labs ordered are listed, but only abnormal results are displayed)  Labs Reviewed  CBC WITH DIFFERENTIAL/PLATELET - Abnormal; Notable for the following:       Result Value   WBC 13.3 (*)    RBC 3.57 (*)    Hemoglobin 10.8 (*)    HCT 31.9 (*)    Platelets 444 (*)    Neutro Abs 8.8 (*)    Monocytes Absolute 1.1 (*)    All other components within normal limits  COMPREHENSIVE METABOLIC PANEL - Abnormal; Notable for the following:    Potassium 3.2 (*)    Chloride 95 (*)    Glucose, Bld 100 (*)    BUN <5 (*)    AST 43 (*)    All other components within normal limits  LIPASE, BLOOD  URINALYSIS, ROUTINE W REFLEX MICROSCOPIC  LACTIC ACID, PLASMA  LACTIC ACID, PLASMA   ____________________________________________  EKG  <ECG>   EKG Interpretation  Date/Time:    Ventricular Rate:    PR Interval:    QRS Duration:   QT Interval:    QTC Calculation:   R Axis:     Text Interpretation:         ____________________________________________  RADIOLOGY  No results found. ____________________________________________   PROCEDURES  Procedure(s) performed:   Procedures ____________________________________________   INITIAL IMPRESSION / ASSESSMENT AND PLAN / ED COURSE  Pertinent labs & imaging results that were available during my care of the patient were reviewed by me and considered in my medical decision making (see chart for details).  CT reassuring there aren't any obvious emergent post-op complications. Suspect incisional pain as cause for symptoms, but will be dc to see and fu w/ her surgeon.   ____________________________________________  FINAL CLINICAL IMPRESSION(S) / ED DIAGNOSES  Final diagnoses:  Abdominal pain, unspecified abdominal location    MEDICATIONS GIVEN DURING THIS VISIT:  Medications  HYDROmorphone (DILAUDID) injection 1 mg (1 mg Intravenous Given 11/02/16  1845)  sodium chloride 0.9 % bolus 1,000 mL (0 mLs Intravenous Stopped 11/02/16 2108)  piperacillin-tazobactam (ZOSYN) IVPB 3.375 g (0 g Intravenous Stopped 11/02/16 1950)  iopamidol (ISOVUE-300) 61 % injection (30 mLs  Contrast Given 11/02/16 2118)  HYDROmorphone (DILAUDID) injection 1 mg (1 mg Intravenous Given 11/02/16 1948)  sodium chloride 0.9 % bolus 1,000 mL (0 mLs Intravenous Stopped 11/02/16 2108)  iopamidol (ISOVUE-300) 61 % injection 100 mL (100 mLs Intravenous Contrast Given 11/02/16 2118)  HYDROmorphone (DILAUDID) injection 0.5 mg (0.5 mg Intravenous Given 11/02/16 2325)    NEW OUTPATIENT MEDICATIONS STARTED DURING THIS VISIT:  Discharge Medication List as of 11/02/2016  11:02 PM    START taking these medications   Details  diphenhydrAMINE (BENADRYL) 25 mg capsule Take 1 capsule (25 mg total) by mouth every 8 (eight) hours as needed for sleep., Starting Wed 11/02/2016, Print        Note:  This document was prepared using Dragon voice recognition software and may include unintentional dictation errors.    Marily MemosMesner, Adriona Kaney, MD 11/04/16 1010

## 2016-11-02 NOTE — ED Notes (Signed)
Pt resting with eyes closed, NAD noted, SpO2 at 85% on RA. Attempted to wake pt and used deep breathing with improvement to 88% on RA. Pt placed on 2L [place] and rose to 99%.

## 2017-03-16 ENCOUNTER — Other Ambulatory Visit: Payer: Self-pay

## 2017-03-16 ENCOUNTER — Emergency Department (HOSPITAL_COMMUNITY): Payer: Medicaid Other

## 2017-03-16 ENCOUNTER — Encounter (HOSPITAL_COMMUNITY): Payer: Self-pay | Admitting: Emergency Medicine

## 2017-03-16 ENCOUNTER — Emergency Department (HOSPITAL_COMMUNITY)
Admission: EM | Admit: 2017-03-16 | Discharge: 2017-03-16 | Disposition: A | Discharge status: Home / Self Care | Payer: Medicaid Other | Attending: Emergency Medicine | Admitting: Emergency Medicine

## 2017-03-16 DIAGNOSIS — I10 Essential (primary) hypertension: Secondary | ICD-10-CM | POA: Insufficient documentation

## 2017-03-16 DIAGNOSIS — R10814 Left lower quadrant abdominal tenderness: Secondary | ICD-10-CM | POA: Insufficient documentation

## 2017-03-16 DIAGNOSIS — R140 Abdominal distension (gaseous): Secondary | ICD-10-CM | POA: Diagnosis not present

## 2017-03-16 DIAGNOSIS — R110 Nausea: Secondary | ICD-10-CM | POA: Diagnosis not present

## 2017-03-16 DIAGNOSIS — K5900 Constipation, unspecified: Secondary | ICD-10-CM | POA: Diagnosis not present

## 2017-03-16 DIAGNOSIS — Z79899 Other long term (current) drug therapy: Secondary | ICD-10-CM | POA: Insufficient documentation

## 2017-03-16 DIAGNOSIS — F17210 Nicotine dependence, cigarettes, uncomplicated: Secondary | ICD-10-CM | POA: Insufficient documentation

## 2017-03-16 DIAGNOSIS — R10812 Left upper quadrant abdominal tenderness: Secondary | ICD-10-CM | POA: Insufficient documentation

## 2017-03-16 DIAGNOSIS — R1084 Generalized abdominal pain: Secondary | ICD-10-CM | POA: Insufficient documentation

## 2017-03-16 LAB — PREGNANCY, URINE: PREG TEST UR: NEGATIVE

## 2017-03-16 LAB — URINALYSIS, ROUTINE W REFLEX MICROSCOPIC
Bilirubin Urine: NEGATIVE
GLUCOSE, UA: NEGATIVE mg/dL
Hgb urine dipstick: NEGATIVE
Ketones, ur: NEGATIVE mg/dL
LEUKOCYTES UA: NEGATIVE
Nitrite: NEGATIVE
PROTEIN: NEGATIVE mg/dL
Specific Gravity, Urine: 1.006 (ref 1.005–1.030)
pH: 6 (ref 5.0–8.0)

## 2017-03-16 LAB — COMPREHENSIVE METABOLIC PANEL
ALT: 12 U/L — AB (ref 14–54)
ANION GAP: 9 (ref 5–15)
AST: 17 U/L (ref 15–41)
Albumin: 4.3 g/dL (ref 3.5–5.0)
Alkaline Phosphatase: 62 U/L (ref 38–126)
BUN: 6 mg/dL (ref 6–20)
CHLORIDE: 105 mmol/L (ref 101–111)
CO2: 24 mmol/L (ref 22–32)
CREATININE: 0.67 mg/dL (ref 0.44–1.00)
Calcium: 9.4 mg/dL (ref 8.9–10.3)
Glucose, Bld: 75 mg/dL (ref 65–99)
POTASSIUM: 3.5 mmol/L (ref 3.5–5.1)
Sodium: 138 mmol/L (ref 135–145)
Total Bilirubin: 0.4 mg/dL (ref 0.3–1.2)
Total Protein: 7.4 g/dL (ref 6.5–8.1)

## 2017-03-16 LAB — CBC
HCT: 39.5 % (ref 36.0–46.0)
Hemoglobin: 12.5 g/dL (ref 12.0–15.0)
MCH: 26 pg (ref 26.0–34.0)
MCHC: 31.6 g/dL (ref 30.0–36.0)
MCV: 82.1 fL (ref 78.0–100.0)
PLATELETS: 267 10*3/uL (ref 150–400)
RBC: 4.81 MIL/uL (ref 3.87–5.11)
RDW: 17.3 % — ABNORMAL HIGH (ref 11.5–15.5)
WBC: 8.5 10*3/uL (ref 4.0–10.5)

## 2017-03-16 LAB — LIPASE, BLOOD: LIPASE: 26 U/L (ref 11–51)

## 2017-03-16 MED ORDER — ONDANSETRON HCL 4 MG/2ML IJ SOLN
4.0000 mg | Freq: Once | INTRAMUSCULAR | Status: AC
  Administered 2017-03-16: 4 mg via INTRAVENOUS
  Filled 2017-03-16: qty 2

## 2017-03-16 MED ORDER — POLYETHYLENE GLYCOL 3350 17 G PO PACK: 17.0000 g | PACK | Freq: Every day | ORAL | 0 refills | Status: DC

## 2017-03-16 MED ORDER — FAMOTIDINE IN NACL 20-0.9 MG/50ML-% IV SOLN
20.0000 mg | Freq: Once | INTRAVENOUS | Status: AC
  Administered 2017-03-16: 20 mg via INTRAVENOUS
  Filled 2017-03-16: qty 50

## 2017-03-16 MED ORDER — PANTOPRAZOLE SODIUM 20 MG PO TBEC: 20.0000 mg | DELAYED_RELEASE_TABLET | Freq: Every day | ORAL | 0 refills | Status: DC

## 2017-03-16 MED ORDER — IOPAMIDOL (ISOVUE-300) INJECTION 61%
100.0000 mL | Freq: Once | INTRAVENOUS | Status: AC | PRN
  Administered 2017-03-16: 100 mL via INTRAVENOUS

## 2017-03-16 NOTE — ED Triage Notes (Signed)
Patient complains of abdominal pain and welling with nausea and occasional chills. Patient states symptoms started after she had a spinal fusion in August of last year. She says the pain and swelling has gotten worse in the past few days.

## 2017-03-16 NOTE — Discharge Instructions (Signed)
Your labs and CT imaging today is negative for the obvious source of your chronic pain but I suspect this may be from your constipation.  Take the medicines as prescribed.  Also refer to the food choices which can help with constipation.

## 2017-03-16 NOTE — ED Provider Notes (Signed)
[facility] EMERGENCY DEPARTMENT Provider Note   CSN: [id number] Arrival date & time: 03/16/17  1112     History   Chief Complaint Chief Complaint  Patient presents with  . Abdominal Pain    HPI Yvette West is a 42 y.o. female presenting with a chronic history of abdominal pain and constipation since having a lumbar fusion surgery 5 months ago by an outside [employer] adviser.  She endorses generalized daily abdominal pain in association with acid reflux, abdominal distention and constipation.  She reports having one stool per week on average which sometimes responds to milk of magnesia.  She reports worsened pain over this past week.  She has had intermittent nausea without emesis (not currently) and denies fevers or chills, dysuria, hematuria, flank pain and vaginal discharge.  Her periods have been irregular which she blames on endometriosis, denies risk for stds. She has found no alleviators.  She has not seen her pcp for this complaint.  The history is provided by the patient.    Past Medical History:  Diagnosis Date  . Anginal pain (HCC)   . Arthritis   . Chronic back pain    managed at [facility]  . Depression   . Endometriosis   . GERD (gastroesophageal reflux disease)   . Hypertension   . Lumbar radiculopathy   . Pinched nerve   . Scoliosis   . Scoliosis     Patient Active Problem List   Diagnosis Date Noted  . Excessive or frequent menstruation 10/17/2013    Past Surgical History:  Procedure Laterality Date  . BTL    . CESAREAN SECTION     x2  . CYST REMOVAL HAND     left wrist   . DILITATION & CURRETTAGE/HYSTROSCOPY WITH NOVASURE ABLATION N/A 10/14/2015   Procedure: DILATATION & CURETTAGE/HYSTEROSCOPY WITH NOVASURE ABLATION;  Surgeon: Lazaro Arms, MD;  Location: [facility];  Service: Gynecology;  Laterality: N/A;  novasure length 6.5 width 4.5 power 161 time 47 seconds  . HERNIA REPAIR     umbilical  . TUBAL LIGATION      OB History    Gravida Para Term Preterm  AB Living   5 5 5     5    SAB TAB Ectopic Multiple Live Births           5       Home Medications    Prior to Admission medications   Medication Sig Start Date End Date Taking? Authorizing Provider  acetaminophen (TYLENOL) 325 MG tablet Take 325 mg by mouth every 4 (four) hours as needed.  11/01/16 11/01/17  [provider]  Biotin w/ Vitamins C & E (HAIR/SKIN/NAILS PO) Take 1 tablet by mouth daily.    [provider]  diphenhydrAMINE (BENADRYL) 25 mg capsule Take 1 capsule (25 mg total) by mouth every 8 (eight) hours as needed for sleep. 11/02/16   Mesner, Barbara Cower, MD  gabapentin (NEURONTIN) 300 MG capsule Take 300 mg by mouth 3 (three) times daily. 02/10/15   [provider]  hydrochlorothiazide (HYDRODIURIL) 12.5 MG tablet Take 12.5 mg by mouth daily.    [provider]  losartan (COZAAR) 25 MG tablet Take 25 mg by mouth daily.    [provider]  Multiple Vitamin (MULTIVITAMIN) tablet Take 1 tablet by mouth daily.    [provider]  pantoprazole (PROTONIX) 20 MG tablet Take 1 tablet (20 mg total) by mouth daily. 03/16/17   Burgess Amor, PA-C  polyethylene glycol [facility]) packet Take 17  g by mouth daily. Mixed in fluid of choice 03/16/17   Burgess Amor, PA-C    Family History Family History  Problem Relation Age of Onset  . Hypertension Mother   . Cancer Sister        cervical  . Colon cancer Paternal Grandmother   . Cancer Maternal Uncle        lung    Social History Social History   Tobacco Use  . Smoking status: Current Every Day Smoker    Packs/day: 0.25    Years: 20.00    Pack years: 5.00    Types: Cigarettes  . Smokeless tobacco: Never Used  Substance Use Topics  . Alcohol use: No    Alcohol/week: 0.0 oz  . Drug use: No     Allergies   Lisinopril   Review of Systems Review of Systems  Constitutional: Negative for fever.  HENT: Negative for congestion and sore throat.   Eyes: Negative.   Respiratory:  Negative for chest tightness and shortness of breath.   Cardiovascular: Negative for chest pain.  Gastrointestinal: Positive for abdominal distention, abdominal pain, constipation and nausea.  Genitourinary: Negative.  Negative for dysuria, flank pain and vaginal discharge.  Musculoskeletal: Negative for arthralgias, joint swelling and neck pain.  Skin: Negative.  Negative for rash and wound.  Neurological: Negative for dizziness, weakness, light-headedness, numbness and headaches.  Psychiatric/Behavioral: Negative.      Physical Exam Updated Vital Signs BP 135/79 (BP Location: Right Arm)   Pulse 63   Temp 98.7 F (37.1 C) (Oral)   Resp 19   Ht 5\' 3"  (1.6 m)   Wt 76.7 kg (169 lb)   LMP 02/13/2017   SpO2 100%   BMI 29.94 kg/m   Physical Exam  Constitutional: She appears well-developed and well-nourished.  HENT:  Head: Normocephalic and atraumatic.  Eyes: Conjunctivae are normal.  Neck: Normal range of motion.  Cardiovascular: Normal rate, regular rhythm, normal heart sounds and intact distal pulses.  Pulmonary/Chest: Effort normal and breath sounds normal. She has no wheezes.  Abdominal: Soft. Bowel sounds are normal. She exhibits distension. She exhibits no fluid wave and no mass. There is no hepatosplenomegaly. There is tenderness in the left upper quadrant and left lower quadrant. There is no rigidity, no rebound and no guarding.  Musculoskeletal: Normal range of motion.  Neurological: She is alert.  Skin: Skin is warm and dry.  Psychiatric: She has a normal mood and affect.  Nursing note and vitals reviewed.    ED Treatments / Results  Labs (all labs ordered are listed, but only abnormal results are displayed) Labs Reviewed  COMPREHENSIVE METABOLIC PANEL - Abnormal; Notable for the following components:      Result Value   ALT 12 (*)    All other components within normal limits  CBC - Abnormal; Notable for the following components:   RDW 17.3 (*)    All other  components within normal limits  LIPASE, BLOOD  URINALYSIS, ROUTINE W REFLEX MICROSCOPIC  PREGNANCY, URINE    EKG  EKG Interpretation None       Radiology Ct Abdomen Pelvis W Contrast  Result Date: 03/16/2017 CLINICAL DATA:  Abdominal pain with nausea. EXAM: CT ABDOMEN AND PELVIS WITH CONTRAST TECHNIQUE: Multidetector CT imaging of the abdomen and pelvis was performed using the standard protocol following bolus administration of intravenous contrast. CONTRAST:  ISOVUE-300 IOPAMIDOL (ISOVUE-300) INJECTION 61% COMPARISON:  November 02, 2016 FINDINGS: Lower chest: No acute abnormality. Hepatobiliary: No focal liver abnormality  is seen. No gallstones, gallbladder wall thickening, or biliary dilatation. Pancreas: Unremarkable. No pancreatic ductal dilatation or surrounding inflammatory changes. Spleen: Normal in size without focal abnormality. Adrenals/Urinary Tract: The adrenal glands are normal. Nonobstructing left kidney stone is unchanged. No hydronephrosis is identified bilaterally. No focal renal lesion is noted. The bladder is normal. Stomach/Bowel: Stomach is within normal limits. Appendix appears normal. No evidence of bowel wall thickening, distention, or inflammatory changes. Vascular/Lymphatic: Aortic atherosclerosis. No enlarged abdominal or pelvic lymph nodes. Reproductive: Mild heterogeneous density of the uterine myometrium suggesting myomatous uterus. Left ovarian cyst is identified likely follicular. Minimal free fluid is identified in the pelvis likely physiologic. Other: None. Musculoskeletal: Marked scoliosis status post prior spine surgery with spinal rods. IMPRESSION: No bowel obstruction.  Appendix is normal. No acute abnormality identified in the abdomen and pelvis. Left ovarian cyst, likely follicular. Minimal free fluid in the pelvis, likely physiologic. Electronically Signed   By: Sherian ReinWei-Chen  Lin M.D.   On: 03/16/2017 16:20    Procedures Procedures (including critical  care time)  Medications Ordered in ED Medications  ondansetron (ZOFRAN) injection 4 mg (4 mg Intravenous Given 03/16/17 1504)  famotidine (PEPCID) IVPB 20 mg premix (0 mg Intravenous Stopped 03/16/17 1604)  iopamidol (ISOVUE-300) 61 % injection 100 mL (100 mLs Intravenous Contrast Given 03/16/17 1548)     Initial Impression / Assessment and Plan / ED Course  I have reviewed the triage vital signs and the nursing notes.  Pertinent labs & imaging results that were available during my care of the patient were reviewed by me and considered in my medical decision making (see chart for details).     Labs and Ct imaging reviewed.  Pt with chronic abd pain and constipation, she is on chronic opioid pain med but states no changes since prior to her surgery - she doubts this could be the source of constipation.  She was placed on miralax, protonix given acid reflux sx.  She was advised close f/u with pcp and/or GI - referral given if sx persist or worsen.  No acute findings today.  The patient appears reasonably screened and/or stabilized for discharge and I doubt any other medical condition or other [facility] requiring further screening, evaluation, or treatment in the ED at this time prior to discharge.   Final Clinical Impressions(s) / ED Diagnoses   Final diagnoses:  Generalized abdominal pain  Constipation, unspecified constipation type    ED Discharge Orders        Ordered    pantoprazole (PROTONIX) 20 MG tablet  Daily     03/16/17 1639    polyethylene glycol (MIRALAX) packet  Daily     03/16/17 1639       Burgess Amordol, Azrielle Springsteen, PA-C 03/16/17 1658    Mancel BaleWentz, Elliott, MD 03/21/17 [phone]

## 2017-04-06 ENCOUNTER — Ambulatory Visit: Payer: Medicaid Other | Admitting: Obstetrics & Gynecology

## 2017-04-06 ENCOUNTER — Other Ambulatory Visit: Payer: Self-pay

## 2017-04-06 ENCOUNTER — Encounter: Payer: Self-pay | Admitting: Obstetrics & Gynecology

## 2017-04-06 VITALS — BP 120/78 | HR 82 | Ht 63.0 in | Wt 175.0 lb

## 2017-04-06 DIAGNOSIS — N946 Dysmenorrhea, unspecified: Secondary | ICD-10-CM | POA: Diagnosis not present

## 2017-04-06 DIAGNOSIS — R102 Pelvic and perineal pain: Secondary | ICD-10-CM | POA: Diagnosis not present

## 2017-04-06 MED ORDER — KETOROLAC TROMETHAMINE 10 MG PO TABS: 10.0000 mg | ORAL_TABLET | Freq: Three times a day (TID) | ORAL | 0 refills | Status: DC | PRN

## 2017-04-06 NOTE — Progress Notes (Signed)
Chief Complaint  Patient presents with  . Follow-up    ED visit; pelvic pain; possible fibroids      42 y.o. [id number] Patient's last menstrual period was 03/13/2017 (approximate). The current method of family planning is tubal ligation.  Outpatient Encounter Medications as of 04/06/2017  Medication Sig Note  . acetaminophen (TYLENOL) 325 MG tablet Take 325 mg by mouth every 4 (four) hours as needed.    . Biotin w/ Vitamins C & E (HAIR/SKIN/NAILS PO) Take 1 tablet by mouth daily.   . diphenhydrAMINE (BENADRYL) 25 mg capsule Take 1 capsule (25 mg total) by mouth every 8 (eight) hours as needed for sleep.   . hydrochlorothiazide (HYDRODIURIL) 12.5 MG tablet Take 12.5 mg by mouth daily.   Marland Kitchen losartan (COZAAR) 25 MG tablet Take 25 mg by mouth daily.   . Multiple Vitamin (MULTIVITAMIN) tablet Take 1 tablet by mouth daily.   Marland Kitchen oxyCODONE-acetaminophen (PERCOCET) 10-325 MG tablet Take 1 tablet by mouth every 6 (six) hours as needed for pain.   . pantoprazole (PROTONIX) 20 MG tablet Take 1 tablet (20 mg total) by mouth daily.   . polyethylene glycol (MIRALAX) packet Take 17 g by mouth daily. Mixed in fluid of choice   . senna-docusate (SENOKOT-S) 8.6-50 MG tablet Take by mouth.   . gabapentin (NEURONTIN) 300 MG capsule Take 300 mg by mouth 3 (three) times daily. 04/06/2015: Received from: [facility] Received Sig: Take by mouth.  Marland Kitchen ketorolac (TORADOL) 10 MG tablet Take 1 tablet (10 mg total) by mouth every 8 (eight) hours as needed.    No facility-administered encounter medications on file as of 04/06/2017.     Subjective Seen as follow up from ED for pelvic pain CT suggested myomata of the uterus although not definitive Past Medical History:  Diagnosis Date  . Anginal pain (HCC)   . Arthritis   . Chronic back pain    managed at [facility]  . Depression   . Endometriosis   . GERD (gastroesophageal reflux disease)   . Hypertension   . Lumbar radiculopathy   . Pinched  nerve   . Scoliosis   . Scoliosis     Past Surgical History:  Procedure Laterality Date  . BTL    . CESAREAN SECTION     x2  . CYST REMOVAL HAND     left wrist   . DILITATION & CURRETTAGE/HYSTROSCOPY WITH NOVASURE ABLATION N/A 10/14/2015   Procedure: DILATATION & CURETTAGE/HYSTEROSCOPY WITH NOVASURE ABLATION;  Surgeon: Lazaro Arms, MD;  Location: [facility];  Service: Gynecology;  Laterality: N/A;  novasure length 6.5 width 4.5 power 161 time 47 seconds  . HERNIA REPAIR     umbilical  . SPINAL FUSION  09/2016  . TUBAL LIGATION      OB History    Gravida  5   Para  5   Term  5   Preterm      AB      Living  5     SAB      TAB      Ectopic      Multiple      Live Births  5           Allergies  Allergen Reactions  . Lisinopril Cough    Social History   Socioeconomic History  . Marital status: Single    Spouse name: Not on file  . Number of children: Not on file  . Years of  education: Not on file  . Highest education level: Not on file  Occupational History  . Not on file  Social Needs  . Financial resource strain: Not on file  . Food insecurity:    Worry: Not on file    Inability: Not on file  . Transportation needs:    Medical: Not on file    Non-medical: Not on file  Tobacco Use  . Smoking status: Current Every Day Smoker    Packs/day: 0.25    Years: 20.00    Pack years: 5.00    Types: Cigarettes  . Smokeless tobacco: Never Used  Substance and Sexual Activity  . Alcohol use: No    Alcohol/week: 0.0 standard drinks  . Drug use: No  . Sexual activity: Yes    Birth control/protection: Surgical    Comment: tubal and ablation  Lifestyle  . Physical activity:    Days per week: Not on file    Minutes per session: Not on file  . Stress: Not on file  Relationships  . Social connections:    Talks on phone: Not on file    Gets together: Not on file    Attends religious service: Not on file    Active member of club or organization:  Not on file    Attends meetings of clubs or organizations: Not on file    Relationship status: Not on file  Other Topics Concern  . Not on file  Social History Narrative  . Not on file    Family History  Problem Relation Age of Onset  . Hypertension Mother   . Cancer Sister        cervical  . Colon cancer Paternal Grandmother   . Cancer Maternal Uncle        lung    Medications:       Current Outpatient Medications:  .  acetaminophen (TYLENOL) 325 MG tablet, Take 325 mg by mouth every 4 (four) hours as needed. , Disp: , Rfl:  .  Biotin w/ Vitamins C & E (HAIR/SKIN/NAILS PO), Take 1 tablet by mouth daily., Disp: , Rfl:  .  diphenhydrAMINE (BENADRYL) 25 mg capsule, Take 1 capsule (25 mg total) by mouth every 8 (eight) hours as needed for sleep., Disp: 30 capsule, Rfl: 0 .  hydrochlorothiazide (HYDRODIURIL) 12.5 MG tablet, Take 12.5 mg by mouth daily., Disp: , Rfl:  .  losartan (COZAAR) 25 MG tablet, Take 25 mg by mouth daily., Disp: , Rfl:  .  Multiple Vitamin (MULTIVITAMIN) tablet, Take 1 tablet by mouth daily., Disp: , Rfl:  .  oxyCODONE-acetaminophen (PERCOCET) 10-325 MG tablet, Take 1 tablet by mouth every 6 (six) hours as needed for pain., Disp: , Rfl:  .  pantoprazole (PROTONIX) 20 MG tablet, Take 1 tablet (20 mg total) by mouth daily., Disp: 30 tablet, Rfl: 0 .  polyethylene glycol (MIRALAX) packet, Take 17 g by mouth daily. Mixed in fluid of choice, Disp: 30 each, Rfl: 0 .  senna-docusate (SENOKOT-S) 8.6-50 MG tablet, Take by mouth., Disp: , Rfl:  .  gabapentin (NEURONTIN) 300 MG capsule, Take 300 mg by mouth 3 (three) times daily., Disp: , Rfl:  .  ketorolac (TORADOL) 10 MG tablet, Take 1 tablet (10 mg total) by mouth every 8 (eight) hours as needed., Disp: 15 tablet, Rfl: 0  Objective Blood pressure 120/78, pulse 82, height 5\' 3"  (1.6 m), weight 175 lb (79.4 kg), last menstrual period 03/13/2017.  General WDWN female NAD Vulva:  normal appearing vulva with  no masses,  tenderness or lesions Vagina:  normal mucosa, no discharge Cervix:  no cervical motion tenderness and no lesions Uterus:  normal size, contour, position, consistency, mobility, non-tender Adnexa: ovaries:     Pertinent ROS No burning with urination, frequency or urgency No nausea, vomiting or diarrhea Nor fever chills or other constitutional symptoms   Labs or studies Reviewed labs and CT scan from ED    Impression Diagnoses this Encounter::   ICD-10-CM   1. Dysmenorrhea N94.6   2. Pelvic pain R10.2     Established relevant diagnosis(es):   Plan/Recommendations: Meds ordered this encounter  Medications  . ketorolac (TORADOL) 10 MG tablet    Sig: Take 1 tablet (10 mg total) by mouth every 8 (eight) hours as needed.    Dispense:  15 tablet    Refill:  0    Labs or Scans Ordered: No orders of the defined types were placed in this encounter.   Management:: Exam is essentailly unremarkable NSAID for dysmenorrhea/pelvic pain  Follow up Return if symptoms worsen or fail to improve.      All questions were answered.

## 2017-06-12 ENCOUNTER — Telehealth: Payer: Self-pay | Admitting: *Deleted

## 2017-06-12 NOTE — Telephone Encounter (Signed)
Patient states she is still experiencing pain.  Called her pharmacy and Toradol was not sent in.  I informed patient that I do see the prescription was sent on 04/06/17 and receipt was confirmed by pharmacy.  Asked patient if she was calling to make an appointment to discuss surgery but patient stated she needed something for pain.  Was told she needed to not have surgery so she "wouldn't be down for the summer".  Stated she would call the pharmacy and double check and call us back if needed.

## 2018-05-30 ENCOUNTER — Telehealth: Payer: Self-pay | Admitting: *Deleted

## 2018-05-30 NOTE — Telephone Encounter (Signed)
Patient called stating she has been having extreme abdominal pain and she hasn't had her cycle in 2 months, patient doesn't know what is causing this and she would like to be seen. Please advise [phone]

## 2018-05-30 NOTE — Telephone Encounter (Signed)
Pt reports no period since January, She has had negative pregnancy test. She has been using tylenol. Today the pain is worse. Would like to be seen. Advised patient we are trying to avoid OV as much as possible. I told her I would send this message to a provider and see if we can do tele visit or if she needs to be seen. Patient agreeable.

## 2018-05-31 ENCOUNTER — Other Ambulatory Visit: Payer: Self-pay | Admitting: Obstetrics & Gynecology

## 2018-05-31 MED ORDER — KETOROLAC TROMETHAMINE 10 MG PO TABS: 10.0000 mg | ORAL_TABLET | Freq: Three times a day (TID) | ORAL | 0 refills | Status: DC | PRN

## 2018-05-31 MED ORDER — MEDROXYPROGESTERONE ACETATE 10 MG PO TABS
10.0000 mg | ORAL_TABLET | Freq: Every day | ORAL | 0 refills | Status: DC
Start: 2018-05-31 — End: 2018-11-14

## 2018-05-31 NOTE — Telephone Encounter (Signed)
I reviewed her chart She had similar issue 13 months ago  I sent in a Rx for provera which should start her period after she finishes the meds and some toradol which I used last year  I would try this first before being seen

## 2018-05-31 NOTE — Telephone Encounter (Signed)
Called patient back and discussed Dr. Alyssa Grove plan with her. She agrees and will give this time before making appointment.

## 2018-07-09 ENCOUNTER — Other Ambulatory Visit: Payer: Self-pay | Admitting: Internal Medicine

## 2018-07-09 ENCOUNTER — Other Ambulatory Visit (HOSPITAL_COMMUNITY): Payer: Self-pay | Admitting: Internal Medicine

## 2018-07-09 DIAGNOSIS — R1010 Upper abdominal pain, unspecified: Secondary | ICD-10-CM

## 2018-07-20 ENCOUNTER — Other Ambulatory Visit: Payer: Self-pay

## 2018-07-20 ENCOUNTER — Ambulatory Visit (HOSPITAL_COMMUNITY)
Admission: RE | Admit: 2018-07-20 | Discharge: 2018-07-20 | Disposition: A | Discharge status: Home / Self Care | Payer: Medicaid Other | Source: Ambulatory Visit | Attending: Internal Medicine | Admitting: Internal Medicine

## 2018-07-20 DIAGNOSIS — R1010 Upper abdominal pain, unspecified: Secondary | ICD-10-CM | POA: Insufficient documentation

## 2018-08-27 ENCOUNTER — Ambulatory Visit: Payer: Medicaid Other | Admitting: Obstetrics & Gynecology

## 2018-09-06 ENCOUNTER — Ambulatory Visit: Payer: Medicaid Other | Admitting: Obstetrics & Gynecology

## 2018-09-25 ENCOUNTER — Other Ambulatory Visit: Payer: Self-pay

## 2018-09-25 ENCOUNTER — Encounter: Payer: Self-pay | Admitting: Obstetrics & Gynecology

## 2018-09-25 ENCOUNTER — Ambulatory Visit (INDEPENDENT_AMBULATORY_CARE_PROVIDER_SITE_OTHER): Payer: Medicaid Other | Admitting: Obstetrics & Gynecology

## 2018-09-25 VITALS — BP 134/93 | HR 75 | Ht 63.0 in | Wt 202.0 lb

## 2018-09-25 DIAGNOSIS — N941 Unspecified dyspareunia: Secondary | ICD-10-CM

## 2018-09-25 DIAGNOSIS — N946 Dysmenorrhea, unspecified: Secondary | ICD-10-CM | POA: Diagnosis not present

## 2018-09-25 MED ORDER — MEGESTROL ACETATE 40 MG PO TABS: ORAL_TABLET | ORAL | 11 refills | Status: DC

## 2018-09-25 NOTE — Progress Notes (Signed)
Chief Complaint  Patient presents with  . talk about surgery      43 y.o. [id number] Patient's last menstrual period was 09/01/2018. The current method of family planning is tubal ligation and endometrial ablation.  Outpatient Encounter Medications as of 09/25/2018  Medication Sig Note  . amLODipine (NORVASC) 10 MG tablet Take 10 mg by mouth daily.   . Biotin w/ Vitamins C & E (HAIR/SKIN/NAILS PO) Take 1 tablet by mouth daily.   . diphenhydrAMINE (BENADRYL) 25 mg capsule Take 1 capsule (25 mg total) by mouth every 8 (eight) hours as needed for sleep.   . Multiple Vitamin (MULTIVITAMIN) tablet Take 1 tablet by mouth daily.   Marland Kitchen. gabapentin (NEURONTIN) 300 MG capsule Take 300 mg by mouth 3 (three) times daily. 04/06/2015: [facility] Received Sig: Take by mouth.  . hydrochlorothiazide (HYDRODIURIL) 12.5 MG tablet Take 12.5 mg by mouth daily.   Marland Kitchen. ketorolac (TORADOL) 10 MG tablet Take 1 tablet (10 mg total) by mouth every 8 (eight) hours as needed.   Marland Kitchen. losartan (COZAAR) 25 MG tablet Take 25 mg by mouth daily.   . medroxyPROGESTERone (PROVERA) 10 MG tablet Take 1 tablet (10 mg total) by mouth daily. (Patient not taking: Reported on 09/25/2018)   . megestrol (MEGACE) 40 MG tablet 1 tablet daily   . oxyCODONE-acetaminophen (PERCOCET) 10-325 MG tablet Take 1 tablet by mouth every 6 (six) hours as needed for pain.   . pantoprazole (PROTONIX) 20 MG tablet Take 1 tablet (20 mg total) by mouth daily. (Patient not taking: Reported on 09/25/2018)   . polyethylene glycol (MIRALAX) packet Take 17 g by mouth daily. Mixed in fluid of choice (Patient not taking: Reported on 09/25/2018)    No facility-administered encounter medications on file as of 09/25/2018.     Subjective Pt is s/p endometrial ablation 2017 but developed cyclical pelvic pain 2019 without bleeding without hematometra Has continued since then, mnimal to no associated bleeding Having some perimenopausal  symptoms as well Past Medical History:  Diagnosis Date  . Anginal pain ([facility])   . Arthritis   . Chronic back pain    [facility]  . Depression   . Endometriosis   . GERD (gastroesophageal reflux disease)   . Hypertension   . Lumbar radiculopathy   . Pinched nerve   . Scoliosis   . Scoliosis     Past Surgical History:  Procedure Laterality Date  . BTL    . CESAREAN SECTION     x2  . CYST REMOVAL HAND     left wrist   . DILITATION & CURRETTAGE/HYSTROSCOPY WITH NOVASURE ABLATION N/A 10/14/2015   Procedure: DILATATION & CURETTAGE/HYSTEROSCOPY WITH NOVASURE ABLATION;  Surgeon: Lazaro ArmsLuther H , MD;  Location: [facility];  Service: Gynecology;  Laterality: N/A;  novasure length 6.5 width 4.5 power 161 time 47 seconds  . HERNIA REPAIR     umbilical  . SPINAL FUSION  09/2016  . TUBAL LIGATION      OB History    Gravida  5   Para  5   Term  5   Preterm      AB      Living  5     SAB      TAB      Ectopic      Multiple      Live Births  5           Allergies  Allergen Reactions  .  Lisinopril Cough    Social History   Socioeconomic History  . Marital status: Married    Spouse name: Not on file  . Number of children: 5  . Years of education: Not on file  . Highest education level: Not on file  Occupational History  . Not on file  Social Needs  . Financial resource strain: Not on file  . Food insecurity    Worry: Not on file    Inability: Not on file  . Transportation needs    Medical: Not on file    Non-medical: Not on file  Tobacco Use  . Smoking status: Current Every Day Smoker    Packs/day: 0.25    Years: 20.00    Pack years: 5.00    Types: Cigarettes  . Smokeless tobacco: Never Used  Substance and Sexual Activity  . Alcohol use: No    Alcohol/week: 0.0 standard drinks  . Drug use: No  . Sexual activity: Yes    Birth control/protection: Surgical    Comment: tubal and ablation  Lifestyle  . Physical activity    Days per week:  Not on file    Minutes per session: Not on file  . Stress: Not on file  Relationships  . Social Musicianconnections    Talks on phone: Not on file    Gets together: Not on file    Attends religious service: Not on file    Active member of club or organization: Not on file    Attends meetings of clubs or organizations: Not on file    Relationship status: Not on file  Other Topics Concern  . Not on file  Social History Narrative  . Not on file    Family History  Problem Relation Age of Onset  . Hypertension Mother   . Cancer Sister        cervical  . Colon cancer Paternal Grandmother   . Cancer Maternal Uncle        lung    Medications:       Current Outpatient Medications:  .  amLODipine (NORVASC) 10 MG tablet, Take 10 mg by mouth daily., Disp: , Rfl:  .  Biotin w/ Vitamins C & E (HAIR/SKIN/NAILS PO), Take 1 tablet by mouth daily., Disp: , Rfl:  .  diphenhydrAMINE (BENADRYL) 25 mg capsule, Take 1 capsule (25 mg total) by mouth every 8 (eight) hours as needed for sleep., Disp: 30 capsule, Rfl: 0 .  Multiple Vitamin (MULTIVITAMIN) tablet, Take 1 tablet by mouth daily., Disp: , Rfl:  .  gabapentin (NEURONTIN) 300 MG capsule, Take 300 mg by mouth 3 (three) times daily., Disp: , Rfl:  .  hydrochlorothiazide (HYDRODIURIL) 12.5 MG tablet, Take 12.5 mg by mouth daily., Disp: , Rfl:  .  ketorolac (TORADOL) 10 MG tablet, Take 1 tablet (10 mg total) by mouth every 8 (eight) hours as needed., Disp: 15 tablet, Rfl: 0 .  losartan (COZAAR) 25 MG tablet, Take 25 mg by mouth daily., Disp: , Rfl:  .  medroxyPROGESTERone (PROVERA) 10 MG tablet, Take 1 tablet (10 mg total) by mouth daily. (Patient not taking: Reported on 09/25/2018), Disp: 10 tablet, Rfl: 0 .  megestrol (MEGACE) 40 MG tablet, 1 tablet daily, Disp: 30 tablet, Rfl: 11 .  oxyCODONE-acetaminophen (PERCOCET) 10-325 MG tablet, Take 1 tablet by mouth every 6 (six) hours as needed for pain., Disp: , Rfl:  .  pantoprazole (PROTONIX) 20 MG tablet,  Take 1 tablet (20 mg total) by mouth daily. (Patient not  taking: Reported on 09/25/2018), Disp: 30 tablet, Rfl: 0 .  polyethylene glycol (MIRALAX) packet, Take 17 g by mouth daily. Mixed in fluid of choice (Patient not taking: Reported on 09/25/2018), Disp: 30 each, Rfl: 0  Objective Blood pressure (!) 134/93, pulse 75, height 5\' 3"  (1.6 m), weight 202 lb (91.6 kg), last menstrual period 09/01/2018.  Gen WDWN NAD  Pertinent ROS No burning with urination, frequency or urgency No nausea, vomiting or diarrhea Nor fever chills or other constitutional symptoms   Labs or studies     Impression Diagnoses this Encounter::   ICD-10-CM   1. Dysmenorrhea  N94.6   2. Dyspareunia, female  N94.10     Established relevant diagnosis(es):   Plan/Recommendations: Meds ordered this encounter  Medications  . megestrol (MEGACE) 40 MG tablet    Sig: 1 tablet daily    Dispense:  30 tablet    Refill:  11    Labs or Scans Ordered: No orders of the defined types were placed in this encounter.   Management:: >megestrol 40 mg daily to see if taking away cycling will take away her pain symptoms   Follow up Return in about 6 weeks (around 11/06/2018) for Follow up, with Dr Elonda Husky.        Face to face time:  15 minutes  Greater than 50% of the visit time was spent in counseling and coordination of care with the patient.  The summary and outline of the counseling and care coordination is summarized in the note above.   All questions were answered.

## 2018-10-08 ENCOUNTER — Other Ambulatory Visit (HOSPITAL_COMMUNITY): Payer: Self-pay | Admitting: Internal Medicine

## 2018-10-08 DIAGNOSIS — Z1231 Encounter for screening mammogram for malignant neoplasm of breast: Secondary | ICD-10-CM

## 2018-11-06 ENCOUNTER — Ambulatory Visit: Payer: Medicaid Other | Admitting: Obstetrics & Gynecology

## 2018-11-08 ENCOUNTER — Ambulatory Visit: Payer: Medicaid Other | Admitting: Obstetrics & Gynecology

## 2018-11-08 ENCOUNTER — Encounter: Payer: Self-pay | Admitting: Obstetrics & Gynecology

## 2018-11-08 ENCOUNTER — Other Ambulatory Visit: Payer: Self-pay

## 2018-11-08 VITALS — BP 142/90 | HR 74 | Ht 63.0 in | Wt 201.0 lb

## 2018-11-08 DIAGNOSIS — N946 Dysmenorrhea, unspecified: Secondary | ICD-10-CM

## 2018-11-08 DIAGNOSIS — N941 Unspecified dyspareunia: Secondary | ICD-10-CM | POA: Diagnosis not present

## 2018-11-08 NOTE — Progress Notes (Signed)
Preoperative History and Physical  Yvette West is a 43 y.o. [id number] with Patient's last menstrual period was 11/01/2018. admitted for a TAH right oophorectomy.  Pt  Is s/p endometrial ablation 2017 and was having dyspareunia then and I advised hysterectomy however she did not want to do that then She continues with dyspareunia and her bleeding and pain have returned even while on megestrol Only option I have left is hysterectomy, along with cervix because of the dyspareunia and removal of right ovary because of chronic RLQ pain  Pt agrees with surgical plan  PMH:    Past Medical History:  Diagnosis Date  . Anginal pain ([facility])   . Arthritis   . Chronic back pain    managed at [facility]  . Depression   . Endometriosis   . GERD (gastroesophageal reflux disease)   . Hypertension   . Lumbar radiculopathy   . Pinched nerve   . Scoliosis   . Scoliosis     PSH:     Past Surgical History:  Procedure Laterality Date  . BTL    . CESAREAN SECTION     x2  . CYST REMOVAL HAND     left wrist   . DILITATION & CURRETTAGE/HYSTROSCOPY WITH NOVASURE ABLATION N/A 10/14/2015   Procedure: DILATATION & CURETTAGE/HYSTEROSCOPY WITH NOVASURE ABLATION;  Surgeon: Florian Buff, MD;  Location: [facility];  Service: Gynecology;  Laterality: N/A;  novasure length 6.5 width 4.5 power 161 time 47 seconds  . HERNIA REPAIR     umbilical  . SPINAL FUSION  09/2016  . TUBAL LIGATION      POb/GynH:      OB History    Gravida  5   Para  5   Term  5   Preterm      AB      Living  5     SAB      TAB      Ectopic      Multiple      Live Births  5           SH:   Social History   Tobacco Use  . Smoking status: Current Every Day Smoker    Packs/day: 0.25    Years: 20.00    Pack years: 5.00    Types: Cigarettes  . Smokeless tobacco: Never Used  Substance Use Topics  . Alcohol use: No    Alcohol/week: 0.0 standard drinks  . Drug use: No    FH:    Family History  Problem Relation  Age of Onset  . Hypertension Mother   . Cancer Sister        cervical  . Colon cancer Paternal Grandmother   . Cancer Maternal Uncle        lung     Allergies:  Allergies  Allergen Reactions  . Lisinopril Cough    Medications:       Current Outpatient Medications:  .  amLODipine (NORVASC) 10 MG tablet, Take 10 mg by mouth daily., Disp: , Rfl:  .  Biotin w/ Vitamins C & E (HAIR/SKIN/NAILS PO), Take 1 tablet by mouth daily., Disp: , Rfl:  .  diphenhydrAMINE (BENADRYL) 25 mg capsule, Take 1 capsule (25 mg total) by mouth every 8 (eight) hours as needed for sleep., Disp: 30 capsule, Rfl: 0 .  megestrol (MEGACE) 40 MG tablet, 1 tablet daily, Disp: 30 tablet, Rfl: 11 .  Multiple Vitamin (MULTIVITAMIN) tablet, Take 1 tablet by mouth daily., Disp: , Rfl:  .  gabapentin (NEURONTIN) 300 MG capsule, Take 300 mg by mouth 3 (three) times daily., Disp: , Rfl:  .  hydrochlorothiazide (HYDRODIURIL) 12.5 MG tablet, Take 12.5 mg by mouth daily., Disp: , Rfl:  .  ketorolac (TORADOL) 10 MG tablet, Take 1 tablet (10 mg total) by mouth every 8 (eight) hours as needed., Disp: 15 tablet, Rfl: 0 .  losartan (COZAAR) 25 MG tablet, Take 25 mg by mouth daily., Disp: , Rfl:  .  medroxyPROGESTERone (PROVERA) 10 MG tablet, Take 1 tablet (10 mg total) by mouth daily. (Patient not taking: Reported on 09/25/2018), Disp: 10 tablet, Rfl: 0 .  oxyCODONE-acetaminophen (PERCOCET) 10-325 MG tablet, Take 1 tablet by mouth every 6 (six) hours as needed for pain., Disp: , Rfl:  .  pantoprazole (PROTONIX) 20 MG tablet, Take 1 tablet (20 mg total) by mouth daily. (Patient not taking: Reported on 09/25/2018), Disp: 30 tablet, Rfl: 0 .  polyethylene glycol (MIRALAX) packet, Take 17 g by mouth daily. Mixed in fluid of choice (Patient not taking: Reported on 09/25/2018), Disp: 30 each, Rfl: 0  Review of Systems:   Review of Systems  Constitutional: Negative for fever, chills, weight loss, malaise/fatigue and diaphoresis.  HENT:  Negative for hearing loss, ear pain, nosebleeds, congestion, sore throat, neck pain, tinnitus and ear discharge.   Eyes: Negative for blurred vision, double vision, photophobia, pain, discharge and redness.  Respiratory: Negative for cough, hemoptysis, sputum production, shortness of breath, wheezing and stridor.   Cardiovascular: Negative for chest pain, palpitations, orthopnea, claudication, leg swelling and PND.  Gastrointestinal: Positive for abdominal pain. Negative for heartburn, nausea, vomiting, diarrhea, constipation, blood in stool and melena.  Genitourinary: Negative for dysuria, urgency, frequency, hematuria and flank pain.  Musculoskeletal: Negative for myalgias, back pain, joint pain and falls.  Skin: Negative for itching and rash.  Neurological: Negative for dizziness, tingling, tremors, sensory change, speech change, focal weakness, seizures, loss of consciousness, weakness and headaches.  Endo/Heme/Allergies: Negative for environmental allergies and polydipsia. Does not bruise/bleed easily.  Psychiatric/Behavioral: Negative for depression, suicidal ideas, hallucinations, memory loss and substance abuse. The patient is not nervous/anxious and does not have insomnia.      PHYSICAL EXAM:  Blood pressure (!) 142/90, pulse 74, height 5\' 3"  (1.6 m), weight 201 lb (91.2 kg), last menstrual period 11/01/2018.    Vitals reviewed. Constitutional: She is oriented to person, place, and time. She appears well-developed and well-nourished.  HENT:  Head: Normocephalic and atraumatic.  Right Ear: External ear normal.  Left Ear: External ear normal.  Nose: Nose normal.  Mouth/Throat: Oropharynx is clear and moist.  Eyes: Conjunctivae and EOM are normal. Pupils are equal, round, and reactive to light. Right eye exhibits no discharge. Left eye exhibits no discharge. No scleral icterus.  Neck: Normal range of motion. Neck supple. No tracheal deviation present. No thyromegaly present.   Cardiovascular: Normal rate, regular rhythm, normal heart sounds and intact distal pulses.  Exam reveals no gallop and no friction rub.   No murmur heard. Respiratory: Effort normal and breath sounds normal. No respiratory distress. She has no wheezes. She has no rales. She exhibits no tenderness.  GI: Soft. Bowel sounds are normal. She exhibits no distension and no mass. There is tenderness. There is no rebound and no guarding.  Genitourinary:       Vulva is normal without lesions Vagina is pink moist without discharge Cervix normal in appearance and pap is normal Uterus is normal size, contour, position, consistency, mobility, non-tender Adnexa is negative  with normal sized ovaries by sonogram  Musculoskeletal: Normal range of motion. She exhibits no edema and no tenderness.  Neurological: She is alert and oriented to person, place, and time. She has normal reflexes. She displays normal reflexes. No cranial nerve deficit. She exhibits normal muscle tone. Coordination normal.  Skin: Skin is warm and dry. No rash noted. No erythema. No pallor.  Psychiatric: She has a normal mood and affect. Her behavior is normal. Judgment and thought content normal.    Labs: No results found for this or any previous visit (from the past 336 hour(s)).  EKG: Orders placed or performed during the hospital encounter of 04/06/15  . ED EKG  . ED EKG  . EKG 12-Lead  . EKG 12-Lead  . EKG    Imaging Studies: No results found.    Assessment: Dysmenorrhea  Dyspareunia, female  Failed management with ablation and post ablation megestrol therapy  Plan: TAH right oophorectomy 11/21/2018  Amaryllis DykeLuther H Sonda Coppens 11/08/2018 2:25 PM     Face to face time:  15 minutes  Greater than 50% of the visit time was spent in counseling and coordination of care with the patient.  The summary and outline of the counseling and care coordination is summarized in the note above.   All questions were  answered.

## 2018-11-12 ENCOUNTER — Other Ambulatory Visit (HOSPITAL_COMMUNITY): Payer: Medicaid Other

## 2018-11-15 ENCOUNTER — Ambulatory Visit (INDEPENDENT_AMBULATORY_CARE_PROVIDER_SITE_OTHER): Payer: Medicaid Other | Admitting: Nurse Practitioner

## 2018-11-15 NOTE — Patient Instructions (Signed)
Your procedure is scheduled on: 11/21/2018  Report to [facility] at  8:25   AM.  Call this number if you have problems the morning of surgery: [phone]   Remember:   Do not Eat or Drink after midnight   :  Take these medicines the morning of surgery with A SIP OF WATER: Amlodipine, Celexa, and omeprazole   Do not wear jewelry, make-up or nail polish.  Do not wear lotions, powders, or perfumes. You may wear deodorant.  Do not shave 48 hours prior to surgery. Men may shave face and neck.  Do not bring valuables to the hospital.  Contacts, dentures or bridgework may not be worn into surgery.  Leave suitcase in the car. After surgery it may be brought to your room.  For patients admitted to the hospital, checkout time is 11:00 AM the day of discharge.   Patients discharged the day of surgery will not be allowed to drive home.    Special Instructions: Shower using CHG night before surgery and shower the day of surgery use CHG.  Use special wash - you have one bottle of CHG for all showers.  You should use approximately 1/2 of the bottle for each shower.  Hysterectomy Information  A hysterectomy is a surgery to remove your uterus. After surgery, you will no longer have periods. Also, you will no longer be able to get pregnant. Reasons for this surgery You may have this surgery if:  You have bleeding in your vagina: ? That is not normal. ? That does not stop, or that keeps coming back.  You have long-term (chronic) pain in your lower belly (pelvic area).  The lining of your uterus grows outside of the uterus (endometriosis).  The lining of your uterus grows in the muscle of the uterus (adenomyosis).  Your uterus falls down into your vagina (prolapse).  You have a growth in your uterus that causes problems (uterine fibroids).  You have cells that could turn into cancer (precancerous cells).  You have cancer of the uterus or cervix. Types of hysterectomies There are 3 types  of hysterectomies. Depending on the type, the surgery will:  Remove the top part of the uterus (supracervical).  Remove the uterus and the cervix (total).  Remove the uterus, cervix, and tissue that holds the uterus in place (radical). Ways a hysterectomy can be done This surgery may be done in one of these ways:  A cut (incision) is made in the belly (abdomen). The uterus is taken out through the cut.  A cut is made in the vagina. The uterus is taken out through the cut.  Three or four cuts are made in the belly. A device with a camera is put through one of the cuts. The uterus is cut into pieces and taken out through the cuts or the vagina.  Three or four cuts are made in the belly. A device with a camera is put through one of the cuts. The uterus is taken out through the vagina.  Three or four cuts are made in the belly. A computer helps control the surgical tools. The uterus is cut into small pieces. The pieces are taken out through the cuts or through the vagina. Talk with your doctor about which way is best for you. Risks of hysterectomy Generally, this surgery is safe. However, problems can happen, including:  Bleeding.  Needing donated blood (transfusion).  Blood clots.  Infection.  Damage to other structures or organs.  Allergic reactions.  Needing to switch to a different type of surgery. What to expect after surgery  You will be given pain medicine.  You will need to stay in the hospital for 1-2 days.  Follow your doctor's instructions about: ? Exercising. ? Driving. ? What activities are safe for you.  You will need to have someone with you at home for 3-5 days.  You will need to see your doctor after 2-4 weeks.  You may get hot flashes, have night sweats, and have trouble sleeping.  You may need to have Pap tests if your surgery was related to cancer. Talk with your doctor about how often you need Pap tests. Questions to ask your doctor  Do I need  this surgery? Do I have other treatment options?  What are my options for this surgery?  What needs to be removed?  What are the risks?  What are the benefits?  How long will I need to stay in the hospital?  How long will I need to recover?  What symptoms can I expect after the procedure? Summary  A hysterectomy is a surgery to remove your uterus. After surgery, you will no longer have periods. Also, you will no longer be able to get pregnant.  Talk with your doctor about which type of hysterectomy is best for you. This information is not intended to replace advice given to you by your health care provider. Make sure you discuss any questions you have with your health care provider. Document Released: 05/09/2011 Document Revised: 04/19/2018 Document Reviewed: 05/17/2016 Elsevier Patient Education  2020 [employer].  Abdominal Hysterectomy, Care After This sheet gives you information about how to care for yourself after your procedure. Your health care provider may also give you more specific instructions. If you have problems or questions, contact your health care provider. What can I expect after the procedure? After the procedure, it is common to have:  Pain.  Tiredness (fatigue).  Poor appetite.  Lowered interest in sex.  Bleeding and discharge from your vagina. You may need to use a pad in your underwear after this procedure. Follow these instructions at home: Medicines  Take over-the-counter and prescription medicines only as told by your doctor.  Do not take aspirin or ibuprofen. These medicines can cause bleeding.  Ask your doctor if the medicine prescribed to you: ? Requires you to avoid driving or using heavy machinery. ? Can cause trouble pooping (constipation). You may need to take these actions to prevent or treat trouble pooping:  Take over-the-counter or prescription medicines.  Eat foods that are high in fiber. These include beans, whole grains, and  fresh fruits and vegetables.  Limit foods that are high in fat and processed sugars. These include fried or sweet foods. Surgical cut (incision) care      Follow instructions from your doctor about how to take care of your cut from surgery (incision). Make sure you: ? Wash your hands with soap and water before and after you change your bandage (dressing). If you cannot use soap and water, use hand sanitizer. ? Change your bandage as told by your doctor. ? Leave stitches (sutures), skin glue, or skin tape (adhesive) strips in place. They may need to stay in place for 2 weeks or longer. If tape strips get loose and curl up, you may trim the loose edges. Do not remove tape strips completely unless your doctor says it is okay.  Check your cut from surgery every day for signs of infection. Check  for: ? Redness, swelling, or pain. ? Fluid or blood. ? Warmth. ? Pus or a bad smell. Activity  Rest as told by your doctor. ? Do not sit for a long time without moving. Get up to take short walks every 1-2 hours. This is important. Ask for help if you feel weak or unsteady.  Do not lift anything that is heavier than 10 lb (4.5 kg), or the limit that you are told, until your doctor says that it is safe.  Do not drive or use heavy machinery while taking prescription pain medicine.  Follow your doctor's advice about exercise, driving, and general activities. Return to your normal activities as told by your doctor. Ask your doctor what activities are safe for you. Lifestyle  Do not douche, use tampons, or have sex for at least 6 weeks or as told by your doctor.  Do not drink alcohol until your doctor says it is okay.  Do not use any products that contain nicotine or tobacco, such as cigarettes, e-cigarettes, and chewing tobacco. If you need help quitting, ask your doctor. General instructions   Drink enough fluid to keep your pee (urine) pale yellow.  Do not take baths, swim, or use a hot tub  until your doctor approves. Ask your doctor if you may take showers. You may only be allowed to take sponge baths.  Try to have someone at home with you for the first 1-2 weeks to help you with your daily chores at home.  Keep the bandage dry until your doctor says it can be taken off.  Wear tight-fitting (compression) stockings as told by your health care provider. These stockings help to prevent blood clots and reduce swelling in your legs.  Keep all follow-up visits as told by your doctor. This is important. Contact a doctor if:  You have any of these signs of infection: ? Redness, swelling, or pain around your cut. ? Fluid or blood coming from your cut. ? Warmth coming from your cut. ? Pus or a bad smell coming from your cut. ? Chills or a fever.  Your cut breaks open.  You feel dizzy or light-headed.  You have pain or bleeding when you pee.  You keep having watery poop (diarrhea).  You keep feeling like you may vomit (nauseous) or keep vomiting.  You have unusual fluid (discharge) coming from your vagina.  You have a rash.  You have any type of reaction to your medicine that is not normal, or you develop an allergy to your medicine.  Your pain medicine does not help. Get help right away if:  You have a fever and your symptoms get worse all of a sudden.  You have very bad pain in your belly (abdomen).  You are short of breath.  You faint.  You have pain, swelling, or redness of your leg.  You bleed a lot from your vagina and notice clumps of blood (clots). Summary  Do not take baths, swim, or use a hot tub until your doctor approves. Ask your doctor if you may take showers. You may only be allowed to take sponge baths.  Do not lift anything that is heavier than 10 lb (4.5 kg), or the limit that you are told, until your doctor says that it is safe.  Follow your doctor's advice about exercise, driving, and general activities. Ask your doctor what activities are  safe for you.  Try to have someone at home with you for the first 1-2 weeks  to help you with your daily chores at home. This information is not intended to replace advice given to you by your health care provider. Make sure you discuss any questions you have with your health care provider. Document Released: 11/24/2007 Document Revised: 04/19/2018 Document Reviewed: 02/03/2016 Elsevier Patient Education  2020 [employer]  Unilateral Salpingo-Oophorectomy, Care After This sheet gives you information about how to care for yourself after your procedure. Your health care provider may also give you more specific instructions. If you have problems or questions, contact your health care provider. What can I expect after the procedure? After the procedure, it is common to have:  Abdominal pain.  Some occasional vaginal bleeding (spotting).  Tiredness. Follow these instructions at home: Incision care   Keep your incision area and your bandage (dressing) clean and dry.  Follow instructions from your health care provider about how to take care of your incision. Make sure you: ? Wash your hands with soap and water before you change your dressing. If soap and water are not available, use hand sanitizer. ? Change your dressing as told by your health care provider. ? Leave stitches (sutures), staples, skin glue, or adhesive strips in place. These skin closures may need to stay in place for 2 weeks or longer. If adhesive strip edges start to loosen and curl up, you may trim the loose edges. Do not remove adhesive strips completely unless your health care provider tells you to do that.  Check your incision area every day for signs of infection. Check for: ? Redness, swelling, or pain. ? Fluid or blood. ? Warmth. ? Pus or a bad smell. Activity  Do not drive or use heavy machinery while taking prescription pain medicine.  Do not drive for 24 hours if you received a medicine to help you relax  (sedative).  Take frequent, short walks throughout the day. Rest when you get tired. Ask your health care provider what activities are safe for you.  Avoid activities that require great effort. Also, avoid heavy lifting. Do not lift anything that is heavier than 5 lb (2.3 kg), or the limit that your health care provider tells you, until he or she says that it is safe to do so.  Do not douche, use tampons, or have sex until your health care provider approves. General instructions  To prevent or treat constipation while you are taking prescription pain medicine, your health care provider may recommend that you: ? Drink enough fluid to keep your urine pale yellow. ? Take over-the-counter or prescription medicines. ? Eat foods that are high in fiber, such as fresh fruits and vegetables, whole grains, and beans. ? Limit foods that are high in fat and processed sugars, such as fried and sweet foods.  Take over-the-counter and prescription medicines only as told by your health care provider.  Do not take baths, swim, or use a hot tub until your health care provider approves. Ask your health care provider if you may take showers. You may only be allowed to take sponge baths.  Wear compression stockings as told by your health care provider. These stockings help to prevent blood clots and reduce swelling in your legs.  Keep all follow-up visits as told by your health care provider. This is important. Contact a health care provider if:  You have pain when you urinate.  You have pus or a bad smelling discharge coming from your vagina.  You have redness, swelling, or pain around your incision.  You have  fluid or blood coming from your incision.  Your incision feels warm to the touch.  You have pus or a bad smell coming from your incision.  You have a fever.  Your incision starts to break open.  You have abdominal pain that gets worse or does not get better with medicine.  You develop a  rash.  You develop nausea and vomiting.  You feel lightheaded. Get help right away if:  You develop pain in your chest or leg.  You develop shortness of breath.  You faint.  You have increased bleeding from your vagina. Summary  After the procedure, it is common to have pain, tiredness, and occasional bleeding from the vagina.  Follow instructions from your health care provider about how to take care of your incision.  Check your incision every day for signs of infection and report any symptoms to your health care provider.  Follow instructions from your health care provider about activities and restrictions. This information is not intended to replace advice given to you by your health care provider. Make sure you discuss any questions you have with your health care provider. Document Released: 12/11/2008 Document Revised: 01/27/2017 Document Reviewed: 05/26/2016 Elsevier Patient Education  2020 [employer]  General Anesthesia, Adult, Care After This sheet gives you information about how to care for yourself after your procedure. Your health care provider may also give you more specific instructions. If you have problems or questions, contact your health care provider. What can I expect after the procedure? After the procedure, the following side effects are common: Pain or discomfort at the IV site. Nausea. Vomiting. Sore throat. Trouble concentrating. Feeling cold or chills. Weak or tired. Sleepiness and fatigue. Soreness and body aches. These side effects can affect parts of the body that were not involved in surgery. Follow these instructions at home:  For at least 24 hours after the procedure: Have a responsible adult stay with you. It is important to have someone help care for you until you are awake and alert. Rest as needed. Do not: Participate in activities in which you could fall or become injured. Drive. Use heavy machinery. Drink alcohol. Take  sleeping pills or medicines that cause drowsiness. Make important decisions or sign legal documents. Take care of children on your own. Eating and drinking Follow any instructions from your health care provider about eating or drinking restrictions. When you feel hungry, start by eating small amounts of foods that are soft and easy to digest (bland), such as toast. Gradually return to your regular diet. Drink enough fluid to keep your urine pale yellow. If you vomit, rehydrate by drinking water, juice, or clear broth. General instructions If you have sleep apnea, surgery and certain medicines can increase your risk for breathing problems. Follow instructions from your health care provider about wearing your sleep device: Anytime you are sleeping, including during daytime naps. While taking prescription pain medicines, sleeping medicines, or medicines that make you drowsy. Return to your normal activities as told by your health care provider. Ask your health care provider what activities are safe for you. Take over-the-counter and prescription medicines only as told by your health care provider. If you smoke, do not smoke without supervision. Keep all follow-up visits as told by your health care provider. This is important. Contact a health care provider if: You have nausea or vomiting that does not get better with medicine. You cannot eat or drink without vomiting. You have pain that does not get better with  medicine. You are unable to pass urine. You develop a skin rash. You have a fever. You have redness around your IV site that gets worse. Get help right away if: You have difficulty breathing. You have chest pain. You have blood in your urine or stool, or you vomit blood. Summary After the procedure, it is common to have a sore throat or nausea. It is also common to feel tired. Have a responsible adult stay with you for the first 24 hours after general anesthesia. It is important to  have someone help care for you until you are awake and alert. When you feel hungry, start by eating small amounts of foods that are soft and easy to digest (bland), such as toast. Gradually return to your regular diet. Drink enough fluid to keep your urine pale yellow. Return to your normal activities as told by your health care provider. Ask your health care provider what activities are safe for you. This information is not intended to replace advice given to you by your health care provider. Make sure you discuss any questions you have with your health care provider. Document Released: 05/23/2000 Document Revised: 02/17/2017 Document Reviewed: 09/30/2016 Elsevier Patient Education  2020 [employer].

## 2018-11-16 ENCOUNTER — Ambulatory Visit (HOSPITAL_COMMUNITY)
Admission: RE | Admit: 2018-11-16 | Discharge: 2018-11-16 | Disposition: A | Discharge status: Home / Self Care | Payer: Medicaid Other | Source: Ambulatory Visit | Attending: Internal Medicine | Admitting: Internal Medicine

## 2018-11-16 ENCOUNTER — Other Ambulatory Visit: Payer: Self-pay

## 2018-11-16 DIAGNOSIS — Z1231 Encounter for screening mammogram for malignant neoplasm of breast: Secondary | ICD-10-CM | POA: Insufficient documentation

## 2018-11-18 ENCOUNTER — Other Ambulatory Visit: Payer: Self-pay | Admitting: Obstetrics & Gynecology

## 2018-11-19 ENCOUNTER — Encounter (HOSPITAL_COMMUNITY)
Admission: RE | Admit: 2018-11-19 | Discharge: 2018-11-19 | Disposition: A | Discharge status: Home / Self Care | Payer: Medicaid Other | Source: Ambulatory Visit | Attending: Obstetrics & Gynecology | Admitting: Obstetrics & Gynecology

## 2018-11-19 ENCOUNTER — Other Ambulatory Visit: Payer: Self-pay

## 2018-11-19 ENCOUNTER — Other Ambulatory Visit (HOSPITAL_COMMUNITY)
Admission: RE | Admit: 2018-11-19 | Discharge: 2018-11-19 | Disposition: A | Discharge status: Home / Self Care | Payer: Medicaid Other | Source: Ambulatory Visit | Attending: Obstetrics & Gynecology | Admitting: Obstetrics & Gynecology

## 2018-11-19 DIAGNOSIS — Z01812 Encounter for preprocedural laboratory examination: Secondary | ICD-10-CM | POA: Insufficient documentation

## 2018-11-19 LAB — COMPREHENSIVE METABOLIC PANEL
ALT: 15 U/L (ref 0–44)
AST: 23 U/L (ref 15–41)
Albumin: 4.1 g/dL (ref 3.5–5.0)
Alkaline Phosphatase: 44 U/L (ref 38–126)
Anion gap: 10 (ref 5–15)
BUN: 11 mg/dL (ref 6–20)
CO2: 21 mmol/L — ABNORMAL LOW (ref 22–32)
Calcium: 8.9 mg/dL (ref 8.9–10.3)
Chloride: 107 mmol/L (ref 98–111)
Creatinine, Ser: 0.63 mg/dL (ref 0.44–1.00)
GFR calc Af Amer: >60 mL/min (ref >60)
GFR calc non Af Amer: >60 mL/min (ref >60)
Glucose, Bld: 82 mg/dL (ref 70–99)
Potassium: 3.5 mmol/L (ref 3.5–5.1)
Sodium: 138 mmol/L (ref 135–145)
Total Bilirubin: 0.3 mg/dL (ref 0.3–1.2)
Total Protein: 6.8 g/dL (ref 6.5–8.1)

## 2018-11-19 LAB — URINALYSIS, ROUTINE W REFLEX MICROSCOPIC
Bacteria, UA: NONE SEEN
Bilirubin Urine: NEGATIVE
Glucose, UA: NEGATIVE mg/dL
Ketones, ur: NEGATIVE mg/dL
Leukocytes,Ua: NEGATIVE
Nitrite: NEGATIVE
Protein, ur: NEGATIVE mg/dL
Specific Gravity, Urine: 1.013 (ref 1.005–1.030)
pH: 6 (ref 5.0–8.0)

## 2018-11-19 LAB — CBC
HCT: 35.2 % — ABNORMAL LOW (ref 36.0–46.0)
Hemoglobin: 11.7 g/dL — ABNORMAL LOW (ref 12.0–15.0)
MCH: 29.3 pg (ref 26.0–34.0)
MCHC: 33.2 g/dL (ref 30.0–36.0)
MCV: 88.2 fL (ref 80.0–100.0)
Platelets: 292 10*3/uL (ref 150–400)
RBC: 3.99 MIL/uL (ref 3.87–5.11)
RDW: 14.4 % (ref 11.5–15.5)
WBC: 7.7 10*3/uL (ref 4.0–10.5)
nRBC: 0 % (ref 0.0–0.2)

## 2018-11-19 LAB — RAPID HIV SCREEN (HIV 1/2 AB+AG)
HIV 1/2 Antibodies: NONREACTIVE
HIV-1 P24 Antigen - HIV24: NONREACTIVE

## 2018-11-19 LAB — SARS CORONAVIRUS 2 (TAT 6-24 HRS): SARS Coronavirus 2: NEGATIVE

## 2018-11-19 LAB — TYPE AND SCREEN
ABO/RH(D): A POS
Antibody Screen: NEGATIVE

## 2018-11-19 LAB — HCG, QUANTITATIVE, PREGNANCY: hCG, Beta Chain, Quant, S: 1 m[IU]/mL (ref <5)

## 2018-11-21 ENCOUNTER — Inpatient Hospital Stay (HOSPITAL_COMMUNITY): Payer: Medicaid Other | Admitting: Anesthesiology

## 2018-11-21 ENCOUNTER — Encounter (HOSPITAL_COMMUNITY): Payer: Self-pay

## 2018-11-21 ENCOUNTER — Other Ambulatory Visit: Payer: Self-pay

## 2018-11-21 ENCOUNTER — Inpatient Hospital Stay (HOSPITAL_COMMUNITY)
Admission: RE | Admit: 2018-11-21 | Discharge: 2018-11-22 | DRG: 743 | Disposition: A | Discharge status: Home / Self Care | Payer: Medicaid Other | Attending: Obstetrics & Gynecology | Admitting: Obstetrics & Gynecology

## 2018-11-21 ENCOUNTER — Encounter (HOSPITAL_COMMUNITY)
Admission: RE | Disposition: A | Discharge status: Home / Self Care | Payer: Self-pay | Source: Home / Self Care | Attending: Obstetrics & Gynecology

## 2018-11-21 DIAGNOSIS — Z888 Allergy status to other drugs, medicaments and biological substances status: Secondary | ICD-10-CM | POA: Diagnosis not present

## 2018-11-21 DIAGNOSIS — N941 Unspecified dyspareunia: Secondary | ICD-10-CM | POA: Diagnosis present

## 2018-11-21 DIAGNOSIS — N946 Dysmenorrhea, unspecified: Secondary | ICD-10-CM | POA: Diagnosis present

## 2018-11-21 DIAGNOSIS — N938 Other specified abnormal uterine and vaginal bleeding: Secondary | ICD-10-CM | POA: Diagnosis present

## 2018-11-21 DIAGNOSIS — M419 Scoliosis, unspecified: Secondary | ICD-10-CM | POA: Diagnosis present

## 2018-11-21 DIAGNOSIS — I10 Essential (primary) hypertension: Secondary | ICD-10-CM | POA: Diagnosis present

## 2018-11-21 DIAGNOSIS — Z8249 Family history of ischemic heart disease and other diseases of the circulatory system: Secondary | ICD-10-CM

## 2018-11-21 DIAGNOSIS — F17210 Nicotine dependence, cigarettes, uncomplicated: Secondary | ICD-10-CM | POA: Diagnosis present

## 2018-11-21 DIAGNOSIS — N7011 Chronic salpingitis: Secondary | ICD-10-CM | POA: Diagnosis not present

## 2018-11-21 DIAGNOSIS — M5416 Radiculopathy, lumbar region: Secondary | ICD-10-CM | POA: Diagnosis present

## 2018-11-21 DIAGNOSIS — Z90710 Acquired absence of both cervix and uterus: Secondary | ICD-10-CM | POA: Diagnosis present

## 2018-11-21 DIAGNOSIS — N3289 Other specified disorders of bladder: Secondary | ICD-10-CM | POA: Diagnosis not present

## 2018-11-21 HISTORY — PX: SALPINGOOPHORECTOMY: SHX82

## 2018-11-21 HISTORY — PX: ABDOMINAL HYSTERECTOMY: SHX81

## 2018-11-21 SURGERY — HYSTERECTOMY, ABDOMINAL
Anesthesia: General | Laterality: Right

## 2018-11-21 MED ORDER — GABAPENTIN 100 MG PO CAPS
100.0000 mg | ORAL_CAPSULE | Freq: Three times a day (TID) | ORAL | Status: DC
  Administered 2018-11-21: 21:00:00 100 mg via ORAL
  Filled 2018-11-21 (×2): qty 1

## 2018-11-21 MED ORDER — LIDOCAINE HCL (CARDIAC) PF 50 MG/5ML IV SOSY
PREFILLED_SYRINGE | INTRAVENOUS | Status: DC | PRN
  Administered 2018-11-21: 60 mg via INTRAVENOUS

## 2018-11-21 MED ORDER — DEXAMETHASONE SODIUM PHOSPHATE 10 MG/ML IJ SOLN
INTRAMUSCULAR | Status: AC
  Filled 2018-11-21: qty 1

## 2018-11-21 MED ORDER — PROMETHAZINE HCL 25 MG/ML IJ SOLN: 25.0000 mg | Freq: Four times a day (QID) | INTRAMUSCULAR | Status: DC | PRN

## 2018-11-21 MED ORDER — SODIUM CHLORIDE 0.9 % IV SOLN: 8.0000 mg | Freq: Four times a day (QID) | INTRAVENOUS | Status: DC | PRN

## 2018-11-21 MED ORDER — PROMETHAZINE HCL 25 MG/ML IJ SOLN
INTRAMUSCULAR | Status: AC
  Filled 2018-11-21: qty 1

## 2018-11-21 MED ORDER — SUCCINYLCHOLINE CHLORIDE 200 MG/10ML IV SOSY
PREFILLED_SYRINGE | INTRAVENOUS | Status: AC
  Filled 2018-11-21: qty 10

## 2018-11-21 MED ORDER — HYDROCODONE-ACETAMINOPHEN 7.5-325 MG PO TABS: 1.0000 | ORAL_TABLET | Freq: Once | ORAL | Status: DC | PRN

## 2018-11-21 MED ORDER — SUGAMMADEX SODIUM 200 MG/2ML IV SOLN
INTRAVENOUS | Status: DC | PRN
  Administered 2018-11-21: 300 mg via INTRAVENOUS

## 2018-11-21 MED ORDER — CEFAZOLIN SODIUM-DEXTROSE 2-4 GM/100ML-% IV SOLN
2.0000 g | INTRAVENOUS | Status: AC
  Administered 2018-11-21: 2 g via INTRAVENOUS
  Filled 2018-11-21: qty 100

## 2018-11-21 MED ORDER — ONDANSETRON HCL 4 MG PO TABS: 8.0000 mg | ORAL_TABLET | Freq: Four times a day (QID) | ORAL | Status: DC | PRN

## 2018-11-21 MED ORDER — ZOLPIDEM TARTRATE 5 MG PO TABS
5.0000 mg | ORAL_TABLET | Freq: Every evening | ORAL | Status: DC | PRN
  Administered 2018-11-21: 5 mg via ORAL
  Filled 2018-11-21: qty 1

## 2018-11-21 MED ORDER — SODIUM CHLORIDE 0.9 % IV SOLN
INTRAVENOUS | Status: AC
  Filled 2018-11-21: qty 50

## 2018-11-21 MED ORDER — FENTANYL CITRATE (PF) 250 MCG/5ML IJ SOLN
INTRAMUSCULAR | Status: AC
  Filled 2018-11-21: qty 5

## 2018-11-21 MED ORDER — BUPIVACAINE LIPOSOME 1.3 % IJ SUSP: 20.0000 mL | Freq: Once | INTRAMUSCULAR | Status: DC

## 2018-11-21 MED ORDER — MIDAZOLAM HCL 5 MG/5ML IJ SOLN
INTRAMUSCULAR | Status: DC | PRN
  Administered 2018-11-21: 2 mg via INTRAVENOUS

## 2018-11-21 MED ORDER — LACTATED RINGERS IV SOLN
INTRAVENOUS | Status: DC
  Administered 2018-11-21 (×3): via INTRAVENOUS

## 2018-11-21 MED ORDER — ROCURONIUM BROMIDE 10 MG/ML (PF) SYRINGE
PREFILLED_SYRINGE | INTRAVENOUS | Status: AC
  Filled 2018-11-21: qty 10

## 2018-11-21 MED ORDER — ENOXAPARIN SODIUM 40 MG/0.4ML ~~LOC~~ SOLN
40.0000 mg | SUBCUTANEOUS | Status: DC
  Administered 2018-11-22: 40 mg via SUBCUTANEOUS
  Filled 2018-11-21: qty 0.4

## 2018-11-21 MED ORDER — BUPIVACAINE LIPOSOME 1.3 % IJ SUSP
INTRAMUSCULAR | Status: DC | PRN
  Administered 2018-11-21: 266 mg

## 2018-11-21 MED ORDER — SODIUM CHLORIDE (PF) 0.9 % IJ SOLN
INTRAMUSCULAR | Status: AC
  Filled 2018-11-21: qty 40

## 2018-11-21 MED ORDER — BUPIVACAINE LIPOSOME 1.3 % IJ SUSP
INTRAMUSCULAR | Status: AC
  Filled 2018-11-21: qty 20

## 2018-11-21 MED ORDER — PROPOFOL 10 MG/ML IV BOLUS
INTRAVENOUS | Status: AC
  Filled 2018-11-21: qty 40

## 2018-11-21 MED ORDER — PROMETHAZINE HCL 25 MG/ML IJ SOLN
6.2500 mg | INTRAMUSCULAR | Status: DC | PRN
  Administered 2018-11-21: 6.25 mg via INTRAVENOUS

## 2018-11-21 MED ORDER — SENNOSIDES-DOCUSATE SODIUM 8.6-50 MG PO TABS: 1.0000 | ORAL_TABLET | Freq: Every evening | ORAL | Status: DC | PRN

## 2018-11-21 MED ORDER — ONDANSETRON HCL 4 MG/2ML IJ SOLN
INTRAMUSCULAR | Status: AC
  Filled 2018-11-21: qty 2

## 2018-11-21 MED ORDER — DOCUSATE SODIUM 100 MG PO CAPS
100.0000 mg | ORAL_CAPSULE | Freq: Two times a day (BID) | ORAL | Status: DC
  Administered 2018-11-21 – 2018-11-22 (×3): 100 mg via ORAL
  Filled 2018-11-21 (×3): qty 1

## 2018-11-21 MED ORDER — KETOROLAC TROMETHAMINE 30 MG/ML IJ SOLN
30.0000 mg | Freq: Once | INTRAMUSCULAR | Status: AC
  Administered 2018-11-21: 09:00:00 30 mg via INTRAVENOUS
  Filled 2018-11-21: qty 1

## 2018-11-21 MED ORDER — ROCURONIUM 10MG/ML (10ML) SYRINGE FOR MEDFUSION PUMP - OPTIME
INTRAVENOUS | Status: DC | PRN
  Administered 2018-11-21: 10 mg via INTRAVENOUS
  Administered 2018-11-21: 30 mg via INTRAVENOUS
  Administered 2018-11-21 (×2): 10 mg via INTRAVENOUS

## 2018-11-21 MED ORDER — HEMOSTATIC AGENTS (NO CHARGE) OPTIME
TOPICAL | Status: DC | PRN
  Administered 2018-11-21: 1 via TOPICAL

## 2018-11-21 MED ORDER — MIDAZOLAM HCL 2 MG/2ML IJ SOLN
INTRAMUSCULAR | Status: AC
  Filled 2018-11-21: qty 2

## 2018-11-21 MED ORDER — CITALOPRAM HYDROBROMIDE 20 MG PO TABS
40.0000 mg | ORAL_TABLET | Freq: Every day | ORAL | Status: DC
  Administered 2018-11-21 – 2018-11-22 (×2): 40 mg via ORAL
  Filled 2018-11-21 (×2): qty 2

## 2018-11-21 MED ORDER — MIDAZOLAM HCL 2 MG/2ML IJ SOLN: 0.5000 mg | Freq: Once | INTRAMUSCULAR | Status: DC | PRN

## 2018-11-21 MED ORDER — PANTOPRAZOLE SODIUM 40 MG PO TBEC
40.0000 mg | DELAYED_RELEASE_TABLET | Freq: Every day | ORAL | Status: DC
  Administered 2018-11-21 – 2018-11-22 (×2): 40 mg via ORAL
  Filled 2018-11-21 (×2): qty 1

## 2018-11-21 MED ORDER — BISACODYL 10 MG RE SUPP: 10.0000 mg | Freq: Every day | RECTAL | Status: DC | PRN

## 2018-11-21 MED ORDER — ONDANSETRON HCL 4 MG/2ML IJ SOLN
INTRAMUSCULAR | Status: DC | PRN
  Administered 2018-11-21: 4 mg via INTRAVENOUS

## 2018-11-21 MED ORDER — DIPHENHYDRAMINE HCL 50 MG/ML IJ SOLN: 25.0000 mg | Freq: Four times a day (QID) | INTRAMUSCULAR | Status: DC | PRN

## 2018-11-21 MED ORDER — IBUPROFEN 800 MG PO TABS
800.0000 mg | ORAL_TABLET | Freq: Four times a day (QID) | ORAL | Status: DC
  Administered 2018-11-22: 800 mg via ORAL
  Filled 2018-11-21: qty 1

## 2018-11-21 MED ORDER — FENTANYL CITRATE (PF) 100 MCG/2ML IJ SOLN: 50.0000 ug | INTRAMUSCULAR | Status: DC | PRN

## 2018-11-21 MED ORDER — AMLODIPINE BESYLATE 5 MG PO TABS
10.0000 mg | ORAL_TABLET | Freq: Every day | ORAL | Status: DC
  Filled 2018-11-21 (×2): qty 2

## 2018-11-21 MED ORDER — HYDROMORPHONE HCL 1 MG/ML IJ SOLN
0.2500 mg | INTRAMUSCULAR | Status: DC | PRN
  Administered 2018-11-21 (×4): 0.5 mg via INTRAVENOUS
  Filled 2018-11-21 (×4): qty 0.5

## 2018-11-21 MED ORDER — LIDOCAINE 2% (20 MG/ML) 5 ML SYRINGE
INTRAMUSCULAR | Status: AC
  Filled 2018-11-21: qty 5

## 2018-11-21 MED ORDER — OXYCODONE HCL 5 MG PO TABS
5.0000 mg | ORAL_TABLET | ORAL | Status: DC | PRN
  Administered 2018-11-21 – 2018-11-22 (×6): 10 mg via ORAL
  Filled 2018-11-21 (×6): qty 2

## 2018-11-21 MED ORDER — SODIUM CHLORIDE 0.9 % IR SOLN
Status: DC | PRN
  Administered 2018-11-21 (×4): 1000 mL

## 2018-11-21 MED ORDER — ALUM & MAG HYDROXIDE-SIMETH 200-200-20 MG/5ML PO SUSP: 30.0000 mL | ORAL | Status: DC | PRN

## 2018-11-21 MED ORDER — KETOROLAC TROMETHAMINE 30 MG/ML IJ SOLN
30.0000 mg | Freq: Four times a day (QID) | INTRAMUSCULAR | Status: AC
  Administered 2018-11-21 – 2018-11-22 (×4): 30 mg via INTRAVENOUS
  Filled 2018-11-21 (×4): qty 1

## 2018-11-21 MED ORDER — PROPOFOL 10 MG/ML IV BOLUS
INTRAVENOUS | Status: DC | PRN
  Administered 2018-11-21: 200 mg via INTRAVENOUS

## 2018-11-21 MED ORDER — FENTANYL CITRATE (PF) 100 MCG/2ML IJ SOLN
INTRAMUSCULAR | Status: DC | PRN
  Administered 2018-11-21 (×10): 50 ug via INTRAVENOUS

## 2018-11-21 MED ORDER — SODIUM CHLORIDE 0.9% FLUSH
INTRAVENOUS | Status: AC
  Filled 2018-11-21: qty 10

## 2018-11-21 MED ORDER — SUCCINYLCHOLINE 20MG/ML (10ML) SYRINGE FOR MEDFUSION PUMP - OPTIME
INTRAMUSCULAR | Status: DC | PRN
  Administered 2018-11-21: 120 mg via INTRAVENOUS

## 2018-11-21 MED ORDER — KCL IN DEXTROSE-NACL 40-5-0.45 MEQ/L-%-% IV SOLN
INTRAVENOUS | Status: AC
  Administered 2018-11-21: 15:00:00 via INTRAVENOUS

## 2018-11-21 SURGICAL SUPPLY — 54 items
APPLIER CLIP 13 LRG OPEN (CLIP) ×4
CELLS DAT CNTRL 66122 CELL SVR (MISCELLANEOUS) IMPLANT
CLIP APPLIE 13 LRG OPEN (CLIP) ×3 IMPLANT
CLOTH BEACON ORANGE TIMEOUT ST (SAFETY) ×4 IMPLANT
COVER LIGHT HANDLE STERIS (MISCELLANEOUS) ×8 IMPLANT
COVER WAND RF STERILE (DRAPES) ×4 IMPLANT
DERMABOND ADVANCED (GAUZE/BANDAGES/DRESSINGS) ×1
DERMABOND ADVANCED .7 DNX12 (GAUZE/BANDAGES/DRESSINGS) ×3 IMPLANT
DRAPE WARM FLUID 44X44 (DRAPES) ×4 IMPLANT
DRSG OPSITE POSTOP 4X10 (GAUZE/BANDAGES/DRESSINGS) ×4 IMPLANT
DRSG OPSITE POSTOP 4X8 (GAUZE/BANDAGES/DRESSINGS) ×4 IMPLANT
ELECT REM PT RETURN 9FT ADLT (ELECTROSURGICAL) ×4
ELECTRODE REM PT RTRN 9FT ADLT (ELECTROSURGICAL) ×3 IMPLANT
GAUZE 4X4 16PLY RFD (DISPOSABLE) ×4 IMPLANT
GLOVE BIOGEL M 6.5 STRL (GLOVE) ×4 IMPLANT
GLOVE BIOGEL PI IND STRL 6.5 (GLOVE) ×3 IMPLANT
GLOVE BIOGEL PI IND STRL 7.0 (GLOVE) ×12 IMPLANT
GLOVE BIOGEL PI IND STRL 7.5 (GLOVE) ×3 IMPLANT
GLOVE BIOGEL PI IND STRL 8 (GLOVE) ×3 IMPLANT
GLOVE BIOGEL PI INDICATOR 6.5 (GLOVE) ×1
GLOVE BIOGEL PI INDICATOR 7.0 (GLOVE) ×4
GLOVE BIOGEL PI INDICATOR 7.5 (GLOVE) ×1
GLOVE BIOGEL PI INDICATOR 8 (GLOVE) ×1
GLOVE ECLIPSE 8.0 STRL XLNG CF (GLOVE) ×4 IMPLANT
GOWN STRL REUS W/TWL LRG LVL3 (GOWN DISPOSABLE) ×8 IMPLANT
GOWN STRL REUS W/TWL XL LVL3 (GOWN DISPOSABLE) ×4 IMPLANT
HEMOSTAT ARISTA ABSORB 3G PWDR (HEMOSTASIS) ×4 IMPLANT
INST SET MAJOR GENERAL (KITS) ×4 IMPLANT
KIT TURNOVER KIT A (KITS) ×4 IMPLANT
MANIFOLD NEPTUNE II (INSTRUMENTS) ×4 IMPLANT
NEEDLE HYPO 18GX1.5 BLUNT FILL (NEEDLE) ×4 IMPLANT
NEEDLE HYPO 21X1.5 SAFETY (NEEDLE) ×4 IMPLANT
NS IRRIG 1000ML POUR BTL (IV SOLUTION) ×16 IMPLANT
PACK ABDOMINAL MAJOR (CUSTOM PROCEDURE TRAY) ×4 IMPLANT
PAD ARMBOARD 7.5X6 YLW CONV (MISCELLANEOUS) ×4 IMPLANT
RETRACTOR WND ALEXIS 25 LRG (MISCELLANEOUS) IMPLANT
RETRACTOR WND ALEXIS-O 25 LRG (MISCELLANEOUS) ×3 IMPLANT
RTRCTR WOUND ALEXIS 18CM MED (MISCELLANEOUS)
RTRCTR WOUND ALEXIS 25CM LRG (MISCELLANEOUS)
RTRCTR WOUND ALEXIS O 25CM LRG (MISCELLANEOUS) ×4
SET BASIN LINEN APH (SET/KITS/TRAYS/PACK) ×4 IMPLANT
SPONGE LAP 18X18 RF (DISPOSABLE) ×8 IMPLANT
SUT CHROMIC 0 CT 1 (SUTURE) ×4 IMPLANT
SUT MON AB 3-0 SH 27 (SUTURE) ×8 IMPLANT
SUT PLAIN 2 0 XLH (SUTURE) IMPLANT
SUT VIC AB 0 CT1 27 (SUTURE) ×4
SUT VIC AB 0 CT1 27XCR 8 STRN (SUTURE) ×12 IMPLANT
SUT VIC AB 0 CTX 36 (SUTURE) ×1
SUT VIC AB 0 CTX36XBRD ANTBCTR (SUTURE) ×3 IMPLANT
SUT VICRYL 3 0 (SUTURE) ×4 IMPLANT
SYR 20CC LL (SYRINGE) ×4 IMPLANT
SYRINGE REG LEUR 60CC (SYRINGE) ×4 IMPLANT
TOWEL SURG RFD BLUE STRL DISP (DISPOSABLE) ×4 IMPLANT
TRAY FOLEY MTR SLVR 16FR STAT (SET/KITS/TRAYS/PACK) ×4 IMPLANT

## 2018-11-21 NOTE — H&P (Signed)
Preoperative History and Physical  Yvette West is a 43 y.o. [id number] with Patient's last menstrual period was 11/01/2018. admitted for a TAH right oophorectomy.   Yvette West is a 43 y.o. [id number] with Patient's last menstrual period was 11/01/2018. admitted for a TAH right oophorectomy.  Pt  Is s/p endometrial ablation 2017 and was having dyspareunia then and I advised hysterectomy however she did not want to do that then She continues with dyspareunia and her bleeding and pain have returned even while on megestrol Only option I have left is hysterectomy, along with cervix because of the dyspareunia and removal of right ovary because of chronic RLQ pain  Pt agrees with surgical plan   PMH:    Past Medical History:  Diagnosis Date  . Anginal pain ([place])   . Arthritis   . Chronic back pain    managed at [facility]  . Depression   . Endometriosis   . GERD (gastroesophageal reflux disease)   . Hypertension   . Lumbar radiculopathy   . Pinched nerve   . Scoliosis   . Scoliosis     PSH:     Past Surgical History:  Procedure Laterality Date  . BTL    . CESAREAN SECTION     x2  . CYST REMOVAL HAND     left wrist   . DILITATION & CURRETTAGE/HYSTROSCOPY WITH NOVASURE ABLATION N/A 10/14/2015   Procedure: DILATATION & CURETTAGE/HYSTEROSCOPY WITH NOVASURE ABLATION;  Surgeon: Florian Buff, MD;  Location: [facility];  Service: Gynecology;  Laterality: N/A;  novasure length 6.5 width 4.5 power 161 time 47 seconds  . HERNIA REPAIR     umbilical  . SPINAL FUSION  09/2016  . TUBAL LIGATION      POb/GynH:      OB History    Gravida  5   Para  5   Term  5   Preterm      AB      Living  5     SAB      TAB      Ectopic      Multiple      Live Births  5           SH:   Social History   Tobacco Use  . Smoking status: Current Every Day Smoker    Packs/day: 0.25    Years: 20.00    Pack years: 5.00    Types: Cigarettes  . Smokeless tobacco: Never Used  Substance  Use Topics  . Alcohol use: No    Alcohol/week: 0.0 standard drinks  . Drug use: No    FH:    Family History  Problem Relation Age of Onset  . Hypertension Mother   . Cancer Sister        cervical  . Colon cancer Paternal Grandmother   . Cancer Maternal Uncle        lung     Allergies:  Allergies  Allergen Reactions  . Lisinopril Cough  . Naproxen Sodium Nausea And Vomiting    Medications:       Current Facility-Administered Medications:  .  bupivacaine liposome (EXPAREL) 1.3 % injection 266 mg, 20 mL, Infiltration, Once, ,  H, MD .  ceFAZolin (ANCEF) IVPB 2g/100 mL premix, 2 g, Intravenous, On Call to OR, Florian Buff, MD .  lactated ringers infusion, , Intravenous, Continuous, Lenice Llamas, MD, Last Rate: 50 mL/hr at 11/21/18 1324  Review of Systems:   Review of Systems  Constitutional: Negative for fever, chills, weight loss, malaise/fatigue and diaphoresis.  HENT: Negative for hearing loss, ear pain, nosebleeds, congestion, sore throat, neck pain, tinnitus and ear discharge.   Eyes: Negative for blurred vision, double vision, photophobia, pain, discharge and redness.  Respiratory: Negative for cough, hemoptysis, sputum production, shortness of breath, wheezing and stridor.   Cardiovascular: Negative for chest pain, palpitations, orthopnea, claudication, leg swelling and PND.  Gastrointestinal: Positive for abdominal pain. Negative for heartburn, nausea, vomiting, diarrhea, constipation, blood in stool and melena.  Genitourinary: Negative for dysuria, urgency, frequency, hematuria and flank pain.  Musculoskeletal: Negative for myalgias, back pain, joint pain and falls.  Skin: Negative for itching and rash.  Neurological: Negative for dizziness, tingling, tremors, sensory change, speech change, focal weakness, seizures, loss of consciousness, weakness and headaches.  Endo/Heme/Allergies: Negative for environmental allergies and polydipsia. Does not  bruise/bleed easily.  Psychiatric/Behavioral: Negative for depression, suicidal ideas, hallucinations, memory loss and substance abuse. The patient is not nervous/anxious and does not have insomnia.      PHYSICAL EXAM:  Blood pressure (!) 144/93, pulse 79, temperature 98.6 F (37 C), temperature source Oral, resp. rate 18, last menstrual period 11/01/2018, SpO2 100 %.    Vitals reviewed. Constitutional: She is oriented to person, place, and time. She appears well-developed and well-nourished.  HENT:  Head: Normocephalic and atraumatic.  Right Ear: External ear normal.  Left Ear: External ear normal.  Nose: Nose normal.  Mouth/Throat: Oropharynx is clear and moist.  Eyes: Conjunctivae and EOM are normal. Pupils are equal, round, and reactive to light. Right eye exhibits no discharge. Left eye exhibits no discharge. No scleral icterus.  Neck: Normal range of motion. Neck supple. No tracheal deviation present. No thyromegaly present.  Cardiovascular: Normal rate, regular rhythm, normal heart sounds and intact distal pulses.  Exam reveals no gallop and no friction rub.   No murmur heard. Respiratory: Effort normal and breath sounds normal. No respiratory distress. She has no wheezes. She has no rales. She exhibits no tenderness.  GI: Soft. Bowel sounds are normal. She exhibits no distension and no mass. There is tenderness. There is no rebound and no guarding.  Genitourinary:       Vulva is normal without lesions Vagina is pink moist without discharge Cervix normal in appearance and pap is normal Uterus is normal size, contour, position, consistency, mobility, non-tender Adnexa is negative with normal sized ovaries by sonogram  Musculoskeletal: Normal range of motion. She exhibits no edema and no tenderness.  Neurological: She is alert and oriented to person, place, and time. She has normal reflexes. She displays normal reflexes. No cranial nerve deficit. She exhibits normal muscle tone.  Coordination normal.  Skin: Skin is warm and dry. No rash noted. No erythema. No pallor.  Psychiatric: She has a normal mood and affect. Her behavior is normal. Judgment and thought content normal.    Labs: Results for orders placed or performed during the hospital encounter of 11/19/18 (from the past 336 hour(s))  Urinalysis, Routine w reflex microscopic   Collection Time: 11/19/18 10:56 AM  Result Value Ref Range   Color, Urine YELLOW YELLOW   APPearance CLEAR CLEAR   Specific Gravity, Urine 1.013 1.005 - 1.030   pH 6.0 5.0 - 8.0   Glucose, UA NEGATIVE NEGATIVE mg/dL   Hgb urine dipstick SMALL (A) NEGATIVE   Bilirubin Urine NEGATIVE NEGATIVE   Ketones, ur NEGATIVE NEGATIVE mg/dL   Protein, ur NEGATIVE NEGATIVE mg/dL   Nitrite NEGATIVE NEGATIVE  Leukocytes,Ua NEGATIVE NEGATIVE   RBC / HPF 0-5 0 - 5 RBC/hpf   WBC, UA 0-5 0 - 5 WBC/hpf   Bacteria, UA NONE SEEN NONE SEEN   Squamous Epithelial / LPF 0-5 0 - 5  CBC   Collection Time: 11/19/18 11:02 AM  Result Value Ref Range   WBC 7.7 4.0 - 10.5 K/uL   RBC 3.99 3.87 - 5.11 MIL/uL   Hemoglobin 11.7 (L) 12.0 - 15.0 g/dL   HCT 19.7 (L) 58.8 - 32.5 %   MCV 88.2 80.0 - 100.0 fL   MCH 29.3 26.0 - 34.0 pg   MCHC 33.2 30.0 - 36.0 g/dL   RDW 49.8 26.4 - 15.8 %   Platelets 292 150 - 400 K/uL   nRBC 0.0 0.0 - 0.2 %  Comprehensive metabolic panel   Collection Time: 11/19/18 11:02 AM  Result Value Ref Range   Sodium 138 135 - 145 mmol/L   Potassium 3.5 3.5 - 5.1 mmol/L   Chloride 107 98 - 111 mmol/L   CO2 21 (L) 22 - 32 mmol/L   Glucose, Bld 82 70 - 99 mg/dL   BUN 11 6 - 20 mg/dL   Creatinine, Ser 3.09 0.44 - 1.00 mg/dL   Calcium 8.9 8.9 - 40.7 mg/dL   Total Protein 6.8 6.5 - 8.1 g/dL   Albumin 4.1 3.5 - 5.0 g/dL   AST 23 15 - 41 U/L   ALT 15 0 - 44 U/L   Alkaline Phosphatase 44 38 - 126 U/L   Total Bilirubin 0.3 0.3 - 1.2 mg/dL   GFR calc non Af Amer >60 >60 mL/min   GFR calc Af Amer >60 >60 mL/min   Anion gap 10 5 - 15   hCG, quantitative, pregnancy   Collection Time: 11/19/18 11:02 AM  Result Value Ref Range   hCG, Beta Chain, Quant, S 1 <5 mIU/mL  Rapid HIV screen (HIV 1/2 Ab+Ag)   Collection Time: 11/19/18 11:02 AM  Result Value Ref Range   HIV-1 P24 Antigen - HIV24 NON REACTIVE NON REACTIVE   HIV 1/2 Antibodies NON REACTIVE NON REACTIVE   Interpretation (HIV Ag Ab)      A non reactive test result means that HIV 1 or HIV 2 antibodies and HIV 1 p24 antigen were not detected in the specimen.  Type and screen   Collection Time: 11/19/18 11:02 AM  Result Value Ref Range   ABO/RH(D) A POS    Antibody Screen NEG    Sample Expiration 12/03/2018,2359    Extend sample reason      NO TRANSFUSIONS OR PREGNANCY IN THE PAST 3 MONTHS Performed at [facility], [address], [place], [place] [address]   Results for orders placed or performed during the hospital encounter of 11/19/18 (from the past 336 hour(s))  SARS CORONAVIRUS 2 (TAT 6-24 HRS) Nasopharyngeal Nasopharyngeal Swab   Collection Time: 11/19/18  7:04 AM   Specimen: Nasopharyngeal Swab  Result Value Ref Range   SARS Coronavirus 2 NEGATIVE NEGATIVE    EKG: Orders placed or performed during the hospital encounter of 04/06/15  . ED EKG  . ED EKG  . EKG 12-Lead  . EKG 12-Lead  . EKG    Imaging Studies: Mm 3d Screen Breast Bilateral  Result Date: 11/19/2018 CLINICAL DATA:  Screening. EXAM: DIGITAL SCREENING BILATERAL MAMMOGRAM WITH TOMO AND CAD COMPARISON:  Previous exam(s). ACR Breast Density Category b: There are scattered areas of fibroglandular density. FINDINGS: There are no findings suspicious for malignancy.  Images were processed with CAD. IMPRESSION: No mammographic evidence of malignancy. A result letter of this screening mammogram will be mailed directly to the patient. RECOMMENDATION: Screening mammogram in one year. (Code:SM-B-01Y) BI-RADS CATEGORY  1: Negative. Electronically Signed   By: Britta MccreedySusan  Turner M.D.   On: 11/19/2018 08:40       Assessment: Dysmenorrhea Dyspareunia DUB  S/P endometrial ablation  Plan: TAH right oophorectomy  Pt understands the risks of surgery including but not limited t  excessive bleeding requiring transfusion or reoperation, post-operative infection requiring prolonged hospitalization or re-hospitalization and antibiotic therapy, and damage to other organs including bladder, bowel, ureters and major vessels.  The patient also understands the alternative treatment options which were discussed in full.  All questions were answered.  Lazaro ArmsLuther H  11/21/2018 9:40 AM   Lazaro ArmsLuther H  11/21/2018 9:36 AM

## 2018-11-21 NOTE — Transfer of Care (Signed)
Immediate Anesthesia Transfer of Care Note  Patient: Yvette West  Procedure(s) Performed: TOTAL ABDOMINAL HYSTERECTOMY (N/A ) OPEN RIGHT SALPINGO OOPHORECTOMY (Right ) OOPHORECTOMY (Left )  Patient Location: PACU  Anesthesia Type:General  Level of Consciousness: awake  Airway & Oxygen Therapy: Patient Spontanous Breathing and Patient connected to face mask oxygen  Post-op Assessment: Report given to RN  Post vital signs: Reviewed and stable  Last Vitals:  Vitals Value Taken Time  BP 146/91 11/21/18 1230  Temp    Pulse 82 11/21/18 1236  Resp 15 11/21/18 1236  SpO2 100 % 11/21/18 1236  Vitals shown include unvalidated device data.  Last Pain:  Vitals:   11/21/18 0907  TempSrc: Oral  PainSc: 0-No pain      Patients Stated Pain Goal: 7 (85/46/27 0350)  Complications: No apparent anesthesia complications

## 2018-11-21 NOTE — Op Note (Signed)
Preoperative diagnosis:  1.  Dysmenorrhea                                          2.  dyspareunia                                         3.  DUB                                         4.  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  She was then prepped and draped in the usual sterile fashion and a Foley catheter was placed for continuous bladder drainage.  A Pfannenstiel skin incision was made and carried down sharply to the rectus fascia which was scored in the midline and extended laterally.  The fascia was taken off the muscles superiorly and inferiorly without difficulty.  The muscles were divided.  The peritoneal cavity was entered.  A largeAlexis self-retaining retractor was placed.  The upper abdomen was packed away. Both uterine cornu were grasped with Coker clamps.  The left round ligament was suture ligated and coagulated with the electrocautery unit.  The left  vesicouterine serosal flap was created.  An avascular window in in the peritoneum was created and the utero-ovarian ligament was cross clamped, cut and suture ligated.  There was a large left hydrosalpinx which was removed and resulted in involvement of the left ovarian blood supply. As a result I clamped across the left infundibulo pelvic ligament and removed the left ovary as well.   The right round ligament was suture ligated and cut with the electrocautery unit.  The vesicouterine serosal flap on the right was created.  An avascular window in the peritoneum was created and the right infundibulo pelvic  ligament was cross clamped, cut and double suture ligated.  Thus both ovaries were removed, the right per pre operative plan, the left due to intra operative findings and clinical judgement. A great deal of time was spent freeing the lower uterine segment from the bladder which I suppose was adherent due to previous C sections.    The uterine vessels were skeletonized bilaterally.  The uterine vessels were clamped bilaterally,  then cut and suture ligated.  Serial  pedicles were taken down the cervix medial to the uterine vessels.  Each pedicle was clamped cut and suture ligated with good resulting hemostasis. The vagina was cross clamped and the uterus and cervix were removed en bloc.  The vagina was closed with interrupted figure of eight sutures.    The pelvis was irrigated vigorously and all pedicles were examined and found to be hemostatic.  Arista was placed.    All specimens were sent to pathology for routine evaluation.  The Alexis self-retaining retractor was removed and the pelvis was irrigated vigorously.  All packs were removed and all counts were correct at this point x 3.  The muscles and peritoneum were reapproximated loosely.  The fascia was closed with 0 Vicryl running.    The skin was closed using 3-0 Vicryl on a Keith needle in a subcuticular fashion.  Dermabond was then applied for additional  wound integrity and to serve as a postoperative bacterial barrier.  The patient was awakened from anesthesia taken to the recovery room in good stable condition. All sponge instrument and needle counts were correct x 3.  The patient received Ancef and Toradol prophylactically preoperatively.  Estimated blood loss for the procedure was 250  cc.  Florian Buff, MD  11/21/2018 12:09 PM

## 2018-11-21 NOTE — Anesthesia Preprocedure Evaluation (Signed)
Anesthesia Evaluation  Patient identified by MRN, date of birth, ID band Patient awake    Reviewed: Allergy & Precautions, NPO status , Patient's Chart, lab work & pertinent test results  Airway Mallampati: II  TM Distance: >3 FB Neck ROM: Full    Dental no notable dental hx. (+) Teeth Intact   Pulmonary neg pulmonary ROS, Current SmokerPatient did not abstain from smoking.,    Pulmonary exam normal breath sounds clear to auscultation       Cardiovascular Exercise Tolerance: Good hypertension, Pt. on medications + angina Normal cardiovascular exam Rhythm:Regular Rate:Normal  Pt reports CP ~ 1 month ago -went to MD -said it was stress. Denies any CP after -reports good ET    Neuro/Psych PSYCHIATRIC DISORDERS Depression  Neuromuscular disease    GI/Hepatic Neg liver ROS, GERD  Medicated and Controlled,  Endo/Other  negative endocrine ROS  Renal/GU negative Renal ROS  negative genitourinary   Musculoskeletal  (+) Arthritis , Osteoarthritis,    Abdominal   Peds negative pediatric ROS (+)  Hematology negative hematology ROS (+) anemia ,   Anesthesia Other Findings Pt S/P Spinal Fusion in past  Reproductive/Obstetrics negative OB ROS                             Anesthesia Physical Anesthesia Plan  ASA: III  Anesthesia Plan: General   Post-op Pain Management:    Induction: Intravenous  PONV Risk Score and Plan: 2 and Midazolam, Dexamethasone, Ondansetron and Treatment may vary due to age or medical condition  Airway Management Planned: Oral ETT  Additional Equipment:   Intra-op Plan:   Post-operative Plan: Extubation in OR  Informed Consent: I have reviewed the patients History and Physical, chart, labs and discussed the procedure including the risks, benefits and alternatives for the proposed anesthesia with the patient or authorized representative who has indicated his/her  understanding and acceptance.     Dental advisory given  Plan Discussed with: CRNA  Anesthesia Plan Comments: (Plan Full PPE use Plan GETA D/W PT -WTP with same after Q&A)        Anesthesia Quick Evaluation

## 2018-11-21 NOTE — Anesthesia Procedure Notes (Signed)
Procedure Name: Intubation Date/Time: 11/21/2018 10:17 AM Performed by: Ollen Bowl, CRNA Pre-anesthesia Checklist: Patient identified, Patient being monitored, Timeout performed, Emergency Drugs available and Suction available Patient Re-evaluated:Patient Re-evaluated prior to induction Oxygen Delivery Method: Circle system utilized Preoxygenation: Pre-oxygenation with 100% oxygen Induction Type: IV induction Ventilation: Mask ventilation without difficulty Laryngoscope Size: Mac and 3 Grade View: Grade I Tube type: Oral Tube size: 7.0 mm Number of attempts: 1 Airway Equipment and Method: Stylet Placement Confirmation: ETT inserted through vocal cords under direct vision,  positive ETCO2 and breath sounds checked- equal and bilateral Secured at: 22 cm Tube secured with: Tape Dental Injury: Teeth and Oropharynx as per pre-operative assessment

## 2018-11-21 NOTE — Anesthesia Postprocedure Evaluation (Signed)
Anesthesia Post Note Late entry for [id number]  Patient: Yvette West  Procedure(s) Performed: TOTAL ABDOMINAL HYSTERECTOMY (N/A ) OPEN RIGHT SALPINGO OOPHORECTOMY (Right ) OOPHORECTOMY (Left )  Patient location during evaluation: PACU Anesthesia Type: General Level of consciousness: awake and alert and oriented Pain management: pain level controlled (Pain controlled with Dilaudid) Vital Signs Assessment: post-procedure vital signs reviewed and stable Respiratory status: spontaneous breathing Cardiovascular status: stable : Some nausea; Phenergan given. Anesthetic complications: no     Last Vitals:  Vitals:   11/21/18 1315 11/21/18 1355  BP: 139/84 (!) 150/99  Pulse: 82 81  Resp: 12 16  Temp:  36.8 C  SpO2: 99% 100%    Last Pain:  Vitals:   11/21/18 1355  TempSrc: Oral  PainSc:                  Allsion [place] A

## 2018-11-21 NOTE — Progress Notes (Signed)
From PACU today around 1400.  Very drowsy initially but easily awakens and oriented x 4.  Dressing to abd dry and intact. Vitals stable and C/O pain rated an 8.  Given scheduled toradol this afternoon as well as oxycodone 10 mg twice which she says helps.  Foley removed at 1800 and assisted to sit up in chair.  Sitting in chair now watching tv and talking on phone.

## 2018-11-22 ENCOUNTER — Encounter (HOSPITAL_COMMUNITY): Payer: Self-pay | Admitting: Obstetrics & Gynecology

## 2018-11-22 LAB — CBC
HCT: 30.8 % — ABNORMAL LOW (ref 36.0–46.0)
Hemoglobin: 9.9 g/dL — ABNORMAL LOW (ref 12.0–15.0)
MCH: 28.9 pg (ref 26.0–34.0)
MCHC: 32.1 g/dL (ref 30.0–36.0)
MCV: 89.8 fL (ref 80.0–100.0)
Platelets: 236 10*3/uL (ref 150–400)
RBC: 3.43 MIL/uL — ABNORMAL LOW (ref 3.87–5.11)
RDW: 14.7 % (ref 11.5–15.5)
WBC: 6.4 10*3/uL (ref 4.0–10.5)
nRBC: 0 % (ref 0.0–0.2)

## 2018-11-22 LAB — BASIC METABOLIC PANEL
Anion gap: 6 (ref 5–15)
BUN: 9 mg/dL (ref 6–20)
CO2: 24 mmol/L (ref 22–32)
Calcium: 7.9 mg/dL — ABNORMAL LOW (ref 8.9–10.3)
Chloride: 106 mmol/L (ref 98–111)
Creatinine, Ser: 0.72 mg/dL (ref 0.44–1.00)
GFR calc Af Amer: >60 mL/min (ref >60)
GFR calc non Af Amer: >60 mL/min (ref >60)
Glucose, Bld: 126 mg/dL — ABNORMAL HIGH (ref 70–99)
Potassium: 3.3 mmol/L — ABNORMAL LOW (ref 3.5–5.1)
Sodium: 136 mmol/L (ref 135–145)

## 2018-11-22 LAB — SURGICAL PATHOLOGY

## 2018-11-22 MED ORDER — SEVOFLURANE IN SOLN
RESPIRATORY_TRACT | Status: AC
  Filled 2018-11-22: qty 250

## 2018-11-22 MED ORDER — ESTRADIOL 2 MG PO TABS: 2.0000 mg | ORAL_TABLET | Freq: Every day | ORAL | 11 refills | Status: DC

## 2018-11-22 MED ORDER — OXYCODONE HCL 5 MG PO TABS: 5.0000 mg | ORAL_TABLET | ORAL | 0 refills | Status: DC | PRN

## 2018-11-22 MED ORDER — KETOROLAC TROMETHAMINE 10 MG PO TABS: 10.0000 mg | ORAL_TABLET | Freq: Three times a day (TID) | ORAL | 0 refills | Status: DC | PRN

## 2018-11-22 MED ORDER — ONDANSETRON HCL 8 MG PO TABS: 8.0000 mg | ORAL_TABLET | Freq: Four times a day (QID) | ORAL | 0 refills | Status: AC | PRN

## 2018-11-22 NOTE — Progress Notes (Addendum)
Has ambulated in room and sat in chair today. Dressing dry and intact and no vaginal bleeding.  Has had prn pain medication for pain rated a 10.  Husband has been at bedside and to drive home.  IV removed and discharge instructions reviewed.  Scripts sent to pharmacy

## 2018-11-22 NOTE — Discharge Summary (Signed)
Physician Discharge Summary  Patient ID: Yvette West MRN: [id number] DOB/AGE: [birth year] 43 y.o.  Admit date: 11/21/2018 Discharge date: 11/22/2018  Admission Diagnoses:s/p TAH BSO  Discharge Diagnoses:  Active Problems:   S/P hysterectomy   Discharged Condition: good  Hospital Course: unremarkable post op course  Consults: None  Significant Diagnostic Studies: labs:   Results for orders placed or performed during the hospital encounter of 11/21/18 (from the past 24 hour(s))  CBC     Status: Abnormal   Collection Time: 11/22/18  6:17 AM  Result Value Ref Range   WBC 6.4 4.0 - 10.5 K/uL   RBC 3.43 (L) 3.87 - 5.11 MIL/uL   Hemoglobin 9.9 (L) 12.0 - 15.0 g/dL   HCT 64.4 (L) 03.4 - 74.2 %   MCV 89.8 80.0 - 100.0 fL   MCH 28.9 26.0 - 34.0 pg   MCHC 32.1 30.0 - 36.0 g/dL   RDW 59.5 63.8 - 75.6 %   Platelets 236 150 - 400 K/uL   nRBC 0.0 0.0 - 0.2 %  Basic metabolic panel     Status: Abnormal   Collection Time: 11/22/18  6:17 AM  Result Value Ref Range   Sodium 136 135 - 145 mmol/L   Potassium 3.3 (L) 3.5 - 5.1 mmol/L   Chloride 106 98 - 111 mmol/L   CO2 24 22 - 32 mmol/L   Glucose, Bld 126 (H) 70 - 99 mg/dL   BUN 9 6 - 20 mg/dL   Creatinine, Ser 4.33 0.44 - 1.00 mg/dL   Calcium 7.9 (L) 8.9 - 10.3 mg/dL   GFR calc non Af Amer >60 >60 mL/min   GFR calc Af Amer >60 >60 mL/min   Anion gap 6 5 - 15    Treatments: surgery: TAH BSO  Discharge Exam: Blood pressure 120/80, pulse 82, temperature 98.4 F (36.9 C), temperature source Oral, resp. rate 18, height 5\' 3"  (1.6 m), weight 86.2 kg, last menstrual period 11/01/2018, SpO2 100 %. General appearance: alert, cooperative and no distress GI: soft, non-tender; bowel sounds normal; no masses,  no organomegaly Incision/Wound:clean dry intact  Disposition: Discharge disposition: 01-Home or Self Care       Discharge Instructions    Call MD for:  persistant nausea and vomiting   Complete by: As directed    Call MD  for:  severe uncontrolled pain   Complete by: As directed    Call MD for:  temperature >100.4   Complete by: As directed    Diet - low sodium heart healthy   Complete by: As directed    Driving Restrictions   Complete by: As directed    No driving for 1 week   Increase activity slowly   Complete by: As directed    Leave dressing on - Keep it clean, dry, and intact until clinic visit   Complete by: As directed    Lifting restrictions   Complete by: As directed    Do not lift more than 10 pounds for 6 weeks   Sexual Activity Restrictions   Complete by: As directed    No sex for 8 weeks     Allergies as of 11/22/2018      Reactions   Lisinopril Cough   Naproxen Sodium Nausea And Vomiting      Medication List    TAKE these medications   amLODipine 10 MG tablet Commonly known as: NORVASC Take 10 mg by mouth daily.   citalopram 40 MG tablet Commonly known as: CELEXA Take  40 mg by mouth daily.   diphenhydramine-acetaminophen 25-500 MG Tabs tablet Commonly known as: TYLENOL PM Take 2 tablets by mouth at bedtime as needed (pain).   estradiol 2 MG tablet Commonly known as: Estrace Take 1 tablet (2 mg total) by mouth daily.   ketorolac 10 MG tablet Commonly known as: TORADOL Take 1 tablet (10 mg total) by mouth every 8 (eight) hours as needed.   multivitamin tablet Take 1 tablet by mouth daily.   omeprazole 40 MG capsule Commonly known as: PRILOSEC Take 40 mg by mouth daily.   ondansetron 8 MG tablet Commonly known as: ZOFRAN Take 1 tablet (8 mg total) by mouth every 6 (six) hours as needed for nausea.   oxyCODONE 5 MG immediate release tablet Commonly known as: Oxy IR/ROXICODONE Take 1-2 tablets (5-10 mg total) by mouth every 4 (four) hours as needed for moderate pain.      Follow-up Information    Florian Buff, MD Follow up in 1 week(s).   Specialties: Obstetrics and Gynecology, Radiology Why: post op Contact information: [place] [phone]           Signed: Florian Buff 11/22/2018, 1:59 PM

## 2018-11-22 NOTE — Discharge Instructions (Signed)
Abdominal Hysterectomy, Care After °This sheet gives you information about how to care for yourself after your procedure. Your health care provider may also give you more specific instructions. If you have problems or questions, contact your health care provider. °What can I expect after the procedure? °After your procedure, it is common to have: °· Pain. °· Fatigue. °· Poor appetite. °· Less interest in sex. °· Vaginal bleeding and discharge. You may need to use a sanitary napkin after this procedure. °Follow these instructions at home: °Bathing °· Do not take baths, swim, or use a hot tub until your health care provider approves. Ask your health care provider if you can take showers. You may only be allowed to take sponge baths for bathing. °· Keep the bandage (dressing) dry until your health care provider says it can be removed. °Incision care ° °· Follow instructions from your health care provider about how to take care of your incision. Make sure you: °? Wash your hands with soap and water before you change your bandage (dressing). If soap and water are not available, use hand sanitizer. °? Change your dressing as told by your health care provider. °? Leave stitches (sutures), skin glue, or adhesive strips in place. These skin closures may need to stay in place for 2 weeks or longer. If adhesive strip edges start to loosen and curl up, you may trim the loose edges. Do not remove adhesive strips completely unless your health care provider tells you to do that. °· Check your incision area every day for signs of infection. Check for: °? Redness, swelling, or pain. °? Fluid or blood. °? Warmth. °? Pus or a bad smell. °Activity °· Do gentle, daily exercises as told by your health care provider. You may be told to take short walks every day and go farther each time. °· Do not lift anything that is heavier than 10 lb (4.5 kg), or the limit that your health care provider tells you, until he or she says that it is  safe. °· Do not drive or use heavy machinery while taking prescription pain medicine. °· Do not drive for 24 hours if you were given a medicine to help you relax (sedative). °· Follow your health care provider's instructions about exercise, driving, and general activities. Ask your health care provider what activities are safe for you. °Lifestyle °· Do not douche, use tampons, or have sex for at least 6 weeks or as told by your health care provider. °· Do not drink alcohol until your health care provider approves. °· Drink enough fluid to keep your urine clear or pale yellow. °· Try to have someone at home with you for the first 1-2 weeks to help. °· Do not use any products that contain nicotine or tobacco, such as cigarettes and e-cigarettes. These can delay healing. If you need help quitting, ask your health care provider. °General instructions °· Take over-the-counter and prescription medicines only as told by your health care provider. °· Do not take aspirin or ibuprofen. These medicines can cause bleeding. °· To prevent or treat constipation while you are taking prescription pain medicine, your health care provider may recommend that you: °? Drink enough fluid to keep your urine clear or pale yellow. °? Take over-the-counter or prescription medicines. °? Eat foods that are high in fiber, such as fresh fruits and vegetables, whole grains, and beans. °? Limit foods that are high in fat and processed sugars, such as fried and sweet foods. °· Keep all   follow-up visits as told by your health care provider. This is important. °Contact a health care provider if: °· You have chills or fever. °· You have redness, swelling, or pain around your incision. °· You have fluid or blood coming from your incision. °· Your incision feels warm to the touch. °· You have pus or a bad smell coming from your incision. °· Your incision breaks open. °· You feel dizzy or light-headed. °· You have pain or bleeding when you urinate. °· You  have persistent diarrhea. °· You have persistent nausea and vomiting. °· You have abnormal vaginal discharge. °· You have a rash. °· You have any type of abnormal reaction or you develop an allergy to your medicine. °· Your pain medicine does not help. °Get help right away if: °· You have a fever and your symptoms suddenly get worse. °· You have severe abdominal pain. °· You have shortness of breath. °· You faint. °· You have pain, swelling, or redness in your leg. °· You have heavy vaginal bleeding with blood clots. °Summary °· After your procedure, it is common to have pain, fatigue and vaginal discharge. °· Do not take baths, swim, or use a hot tub until your health care provider approves. Ask your health care provider if you can take showers. You may only be allowed to take sponge baths for bathing. °· Follow your health care provider's instructions about exercise, driving, and general activities. Ask your health care provider what activities are safe for you. °· Do not lift anything that is heavier than 10 lb (4.5 kg), or the limit that your health care provider tells you, until he or she says that it is safe. °· Try to have someone at home with you for the first 1-2 weeks to help. °This information is not intended to replace advice given to you by your health care provider. Make sure you discuss any questions you have with your health care provider. °Document Released: 09/03/2004 Document Revised: 03/20/2018 Document Reviewed: 02/03/2016 °Elsevier Patient Education © 2020 [employer] ° °

## 2018-11-23 ENCOUNTER — Telehealth: Payer: Self-pay | Admitting: Obstetrics & Gynecology

## 2018-11-23 NOTE — Telephone Encounter (Signed)
Pt states that her son, Einar Pheasant, was her support person at the hospital and through her surgery. He is needing a note to go back to work. Fax number: [phone].

## 2018-11-23 NOTE — Telephone Encounter (Signed)
Letter in epic

## 2018-11-29 ENCOUNTER — Other Ambulatory Visit: Payer: Self-pay

## 2018-11-29 ENCOUNTER — Encounter: Payer: Self-pay | Admitting: Obstetrics & Gynecology

## 2018-11-29 ENCOUNTER — Ambulatory Visit (INDEPENDENT_AMBULATORY_CARE_PROVIDER_SITE_OTHER): Payer: Medicaid Other | Admitting: Obstetrics & Gynecology

## 2018-11-29 VITALS — BP 145/92 | HR 77 | Ht 63.0 in | Wt 196.0 lb

## 2018-11-29 DIAGNOSIS — Z9889 Other specified postprocedural states: Secondary | ICD-10-CM

## 2018-11-29 MED ORDER — OXYCODONE HCL 5 MG PO TABS: 5.0000 mg | ORAL_TABLET | ORAL | 0 refills | Status: DC | PRN

## 2018-11-29 NOTE — Progress Notes (Signed)
  HPI: Patient returns for routine postoperative follow-up having undergone TAH BSO on 11/21/2018.  The patient's immediate postoperative recovery has been unremarkable. Since hospital discharge the patient reports incisional pain.   Current Outpatient Medications: amLODipine (NORVASC) 10 MG tablet, Take 10 mg by mouth daily., Disp: , Rfl:   citalopram (CELEXA) 40 MG tablet, Take 40 mg by mouth daily., Disp: , Rfl:  diphenhydramine-acetaminophen (TYLENOL PM) 25-500 MG TABS tablet, Take 2 tablets by mouth at bedtime as needed (pain)., Disp: , Rfl:  estradiol (ESTRACE) 2 MG tablet, Take 1 tablet (2 mg total) by mouth daily., Disp: 30 tablet, Rfl: 11 ketorolac (TORADOL) 10 MG tablet, Take 1 tablet (10 mg total) by mouth every 8 (eight) hours as needed., Disp: 15 tablet, Rfl: 0 Multiple Vitamin (MULTIVITAMIN) tablet, Take 1 tablet by mouth. , Disp: , Rfl:  omeprazole (PRILOSEC) 40 MG capsule, Take 40 mg by mouth daily., Disp: , Rfl:  ondansetron (ZOFRAN) 8 MG tablet, Take 1 tablet (8 mg total) by mouth every 6 (six) hours as needed for nausea., Disp: 20 tablet, Rfl: 0 oxyCODONE (OXY IR/ROXICODONE) 5 MG immediate release tablet, Take 1-2 tablets (5-10 mg total) by mouth every 4 (four) hours as needed for moderate pain., Disp: 30 tablet, Rfl: 0  No current facility-administered medications for this visit.     Blood pressure (!) 145/92, pulse 77, height 5\' 3"  (1.6 m), weight 196 lb (88.9 kg), last menstrual period 11/01/2018.  Physical Exam: Incision clean dry intact Abdomen is normal post op  Diagnostic Tests:   Pathology: benign  Impression: S/p TAH BSO  Plan: Continue estrogen tablets  Follow up: 4  weeks  Florian Buff, MD

## 2018-12-23 NOTE — Progress Notes (Deleted)
   Subjective:    Patient ID: Yvette West, female    DOB: 10/20/1975, 43 y.o.   MRN: [id number]  HPI Yvette West is a 43 year old female with a history of arthritis, back pain, depression, hypertension and GERD. S/P  TAH BSO on 11/21/2018.   Past Medical History:  Diagnosis Date  . Anginal pain ([place])   . Arthritis   . Chronic back pain    managed at [facility]  . Depression   . Endometriosis   . GERD (gastroesophageal reflux disease)   . Hypertension   . Lumbar radiculopathy   . Pinched nerve   . Scoliosis   . Scoliosis    Past Surgical History:  Procedure Laterality Date  . ABDOMINAL HYSTERECTOMY N/A 11/21/2018   Procedure: TOTAL ABDOMINAL HYSTERECTOMY;  Surgeon: Florian Buff, MD;  Location: [facility];  Service: Gynecology;  Laterality: N/A;  . BTL    . CESAREAN SECTION     x2  . CYST REMOVAL HAND     left wrist   . DILITATION & CURRETTAGE/HYSTROSCOPY WITH NOVASURE ABLATION N/A 10/14/2015   Procedure: DILATATION & CURETTAGE/HYSTEROSCOPY WITH NOVASURE ABLATION;  Surgeon: Florian Buff, MD;  Location: [facility];  Service: Gynecology;  Laterality: N/A;  novasure length 6.5 width 4.5 power 161 time 47 seconds  . HERNIA REPAIR     umbilical  . SALPINGOOPHORECTOMY Bilateral 11/21/2018   Procedure: BILATERAL SALPINGO OOPHORECTOMY;  Surgeon: Florian Buff, MD;  Location: [facility];  Service: Gynecology;  Laterality: Bilateral;  . SPINAL FUSION  09/2016  . TUBAL LIGATION         Review of Systems     Objective:   Physical Exam        Assessment & Plan:

## 2018-12-24 ENCOUNTER — Telehealth: Payer: Self-pay | Admitting: Women's Health

## 2018-12-24 NOTE — Telephone Encounter (Signed)
I called the patient and reminded her of her appointment/restrictions.  I reminded her that she needs to activate her mychart in order to have the virtual visit.  I asked her to call us if she has any issues getting in.

## 2018-12-26 ENCOUNTER — Ambulatory Visit (INDEPENDENT_AMBULATORY_CARE_PROVIDER_SITE_OTHER): Payer: Medicaid Other | Admitting: Nurse Practitioner

## 2018-12-27 ENCOUNTER — Encounter: Payer: Medicaid Other | Admitting: Obstetrics & Gynecology

## 2019-01-01 ENCOUNTER — Encounter: Payer: Self-pay | Admitting: Obstetrics & Gynecology

## 2019-01-01 ENCOUNTER — Other Ambulatory Visit: Payer: Self-pay

## 2019-01-01 ENCOUNTER — Ambulatory Visit (INDEPENDENT_AMBULATORY_CARE_PROVIDER_SITE_OTHER): Payer: Medicaid Other | Admitting: Obstetrics & Gynecology

## 2019-01-01 VITALS — BP 131/86 | HR 92 | Ht 63.0 in | Wt 195.5 lb

## 2019-01-01 DIAGNOSIS — R309 Painful micturition, unspecified: Secondary | ICD-10-CM

## 2019-01-01 DIAGNOSIS — Z9889 Other specified postprocedural states: Secondary | ICD-10-CM

## 2019-01-01 LAB — POCT URINALYSIS DIPSTICK
Blood, UA: NEGATIVE
Glucose, UA: NEGATIVE
Ketones, UA: NEGATIVE
Leukocytes, UA: NEGATIVE
Nitrite, UA: NEGATIVE
Protein, UA: NEGATIVE

## 2019-01-01 NOTE — Addendum Note (Signed)
Addended by: Diona Fanti A on: 01/01/2019 02:53 PM   Modules accepted: Orders

## 2019-01-01 NOTE — Progress Notes (Signed)
  HPI: Patient returns for routine postoperative follow-up having undergone TAH BSO on 11/21/2018.  The patient's immediate postoperative recovery has been unremarkable. Since hospital discharge the patient reports some dysuria over the past 2-3 days, urine is clear on dip, will cu;ture.   Current Outpatient Medications: amLODipine (NORVASC) 10 MG tablet, Take 10 mg by mouth daily., Disp: , Rfl:   citalopram (CELEXA) 40 MG tablet, Take 40 mg by mouth daily., Disp: , Rfl:  diphenhydramine-acetaminophen (TYLENOL PM) 25-500 MG TABS tablet, Take 2 tablets by mouth at bedtime as needed (pain)., Disp: , Rfl:  estradiol (ESTRACE) 2 MG tablet, Take 1 tablet (2 mg total) by mouth daily., Disp: 30 tablet, Rfl: 11 Multiple Vitamin (MULTIVITAMIN) tablet, Take 1 tablet by mouth. , Disp: , Rfl:  omeprazole (PRILOSEC) 40 MG capsule, Take 40 mg by mouth daily., Disp: , Rfl:  ondansetron (ZOFRAN) 8 MG tablet, Take 1 tablet (8 mg total) by mouth every 6 (six) hours as needed for nausea., Disp: 20 tablet, Rfl: 0  No current facility-administered medications for this visit.     Blood pressure 131/86, pulse 92, height 5\' 3"  (1.6 m), weight 195 lb 8 oz (88.7 kg), last menstrual period 11/01/2018.  Physical Exam: Incision clean dry intact Abdominal exam is benign Cuff well healed  Diagnostic Tests:   Pathology: benign  Impression: S/p TAH BSO dysuria  Plan: Estrace 2 mg daily Begin vaginal intercourse 2 weeks  Follow up: prn     Florian Buff, MD

## 2019-01-03 LAB — URINE CULTURE

## 2019-02-05 ENCOUNTER — Other Ambulatory Visit: Payer: Self-pay

## 2019-02-05 ENCOUNTER — Ambulatory Visit (INDEPENDENT_AMBULATORY_CARE_PROVIDER_SITE_OTHER): Payer: Medicare Other | Admitting: Internal Medicine

## 2019-02-05 ENCOUNTER — Ambulatory Visit (INDEPENDENT_AMBULATORY_CARE_PROVIDER_SITE_OTHER): Payer: Medicaid Other | Admitting: Internal Medicine

## 2019-02-05 ENCOUNTER — Encounter (INDEPENDENT_AMBULATORY_CARE_PROVIDER_SITE_OTHER): Payer: Self-pay | Admitting: Internal Medicine

## 2019-02-05 VITALS — BP 131/84 | HR 82 | Temp 98.1°F | Ht 63.0 in | Wt 200.5 lb

## 2019-02-05 DIAGNOSIS — Z1159 Encounter for screening for other viral diseases: Secondary | ICD-10-CM

## 2019-02-05 DIAGNOSIS — K5900 Constipation, unspecified: Secondary | ICD-10-CM | POA: Diagnosis not present

## 2019-02-05 DIAGNOSIS — F17210 Nicotine dependence, cigarettes, uncomplicated: Secondary | ICD-10-CM

## 2019-02-05 DIAGNOSIS — R109 Unspecified abdominal pain: Secondary | ICD-10-CM | POA: Diagnosis not present

## 2019-02-05 DIAGNOSIS — K219 Gastro-esophageal reflux disease without esophagitis: Secondary | ICD-10-CM

## 2019-02-05 DIAGNOSIS — D649 Anemia, unspecified: Secondary | ICD-10-CM

## 2019-02-05 MED ORDER — POLYETHYLENE GLYCOL 3350 17 GM/SCOOP PO POWD: 8.5000 g | Freq: Every day | ORAL | 0 refills | Status: DC

## 2019-02-05 MED ORDER — DEXLANSOPRAZOLE 60 MG PO CPDR: 60.0000 mg | DELAYED_RELEASE_CAPSULE | Freq: Every day | ORAL | 5 refills | Status: AC

## 2019-02-05 MED ORDER — FAMOTIDINE 20 MG PO TABS: 20.0000 mg | ORAL_TABLET | Freq: Every day | ORAL | Status: DC

## 2019-02-05 NOTE — Patient Instructions (Signed)
Hemoccult x1. Esophagogastroduodenoscopy to be scheduled. Decrease coffee intake to no more than 2 cups/day and increase water intake. Try to eat 3 meals daily instead of 1.

## 2019-02-05 NOTE — Progress Notes (Signed)
Presenting complaint;  Heartburn unresponsive therapy, epigastric and lower abdominal pain bloating and constipation.  History of present illness  Patient is 43 year old Afro-American female who is referred through courtesy of [facility] for GI evaluation. Patient states she has long history of reflux esophagitis.  She had antireflux surgery in 2008 and did fine for few years but then had to go back on a PPI.  She states she was doing fine on omeprazole 40 mg daily until about 3 months ago when she had gynecologic surgery.  Since then her GERD symptoms have been very poorly controlled.  She had heartburn multiple times a day she also has heartburn at night 10 at times she wakes up with heartburn.  She has tried Gas-X but it does not help.  She also complains of nausea and daily bloating.  Bloating seem to get worse with meals.  She also complains of epigastric pain which started 3 months ago.  She has not had any vomiting.  Her appetite is poor.  She says she has gained 38 pounds in 1 year but she has gained 10 pounds in the last 3 months despite having poor appetite.  She also has constipation.  She has been using vitamins laxative usually 2 at a time no more than once a week and she is also using glycerin suppositories.  She says she does not eat well.  She generally has 1 meal a day.  She drinks 3 to 4 cups of coffee a day.  She is using ondansetron no more than once a week.  She takes Aleve occasionally and takes Tylenol more often. She also complains of hypogastric pain which only occurs when she voids or has a bowel movement.  Pain is transient and not intractable.  She saw Dr. Elonda Husky last month and her UA and culture were negative.  She had total abdominal hysterectomy on 11/21/2018.  She had extensive adhesions involving the bladder and lower uterine segment and left hydrosalpinx. She smokes half a pack of cigarettes per day which she has done for the last 25 years.  She does not  drink alcohol.  Current Medications: Outpatient Encounter Medications as of 02/05/2019  Medication Sig  . amLODipine (NORVASC) 10 MG tablet Take 10 mg by mouth daily.  . citalopram (CELEXA) 40 MG tablet Take 40 mg by mouth daily.  . diphenhydrAMINE (BENADRYL) 25 mg capsule Take 25 mg by mouth as needed for allergies.  . diphenhydramine-acetaminophen (TYLENOL PM) 25-500 MG TABS tablet Take 2 tablets by mouth at bedtime as needed (pain).  Marland Kitchen docusate sodium (COLACE) 100 MG capsule Take 300 mg by mouth daily.  Marland Kitchen estradiol (ESTRACE) 2 MG tablet Take 1 tablet (2 mg total) by mouth daily.  . Multiple Vitamin (MULTIVITAMIN) tablet Take 1 tablet by mouth.   Marland Kitchen omeprazole (PRILOSEC) 40 MG capsule Take 40 mg by mouth daily.  . ondansetron (ZOFRAN) 8 MG tablet Take 1 tablet (8 mg total) by mouth every 6 (six) hours as needed for nausea.   No facility-administered encounter medications on file as of 02/05/2019.    Past medical history  Chronic GERD.  Status post antireflux surgery in 2008 Hypertension Anxiety and depression Obesity. Endometriosis. She has received hepatitis a and B vaccination in the past. History of scoliosis.  Status post spinal fusion in August 2018. Has residual numbness involving right foot.  She has chronic back pain. Left ganglion excision several years ago. C-section in 1998 and 2011. Umbilical hernia repair. Bilateral bunionectomy.  One site was done in 2008 and the other one in 2010. BSO with hysterectomy/total abdominal hysterectomy in September 2024 dysmenorrhea, dyspareunia dysfunctional uterine bleeding.  Allergies Allergies  Allergen Reactions  . Lisinopril Cough  . Naproxen Sodium Nausea And Vomiting    Family history  Both parents are living.  Father is 21 years old.  He has had ventral hernia repair and doing fairly well.  Mother is 64 and has hypertension. She has 5 brothers and 4 sisters.  2 brothers are astigmatic of 1 has bipolar disorder.  1 sister  has had surgery for scoliosis and the other 3 sisters are in good health.  Social history  Patient is married.  She has 5 children.  She has 3 sons and 2 daughters.  One son has asthma and eczema.  1 son has Chiari malformation with scoliosis.  Other children are in good health. She is a [employer] manager.  She has not yet returned to work since her surgery.  She smokes about half a pack of cigarettes per day which she has done for the last 25 years.  She does not drink alcohol.  She has not been doing any exercise of walking because of numbness in her right foot.   Physical examination Blood pressure 131/84, pulse 82, temperature 98.1 F (36.7 C), temperature source Oral, height _0  (1.6 m), weight 200 lb 8 oz (90.9 kg), last menstrual period 11/01/2018. Patient is alert and in no acute distress. She is wearing facial mask. Conjunctiva is pink. Sclera is nonicteric Oropharyngeal mucosa is normal. No neck masses or thyromegaly noted. Cardiac exam with regular rhythm normal S1 and S2. No murmur or gallop noted. Lungs are clear to auscultation. Abdomen is protuberant with localized prominence in epigastric region.  Bowel sounds are normal.  She has horizontal scar across lower abdomen.  On palpation she has mild tenderness in mid epigastric region as well as in hypogastric area.  No organomegaly or masses. Rectal examination deferred. No LE edema or clubbing noted. She has tattoos over her arms and neck.  Labs/studies Results:   CBC Latest Ref Rng & Units 11/22/2018 11/19/2018 03/16/2017  WBC 4.0 - 10.5 K/uL 6.4 7.7 8.5  Hemoglobin 12.0 - 15.0 g/dL 9.9(L) 11.7(L) 12.5  Hematocrit 36.0 - 46.0 % 30.8(L) 35.2(L) 39.5  Platelets 150 - 400 K/uL 236 292 267    CMP Latest Ref Rng & Units 11/22/2018 11/19/2018 03/16/2017  Glucose 70 - 99 mg/dL 126(H) 82 75  BUN 6 - 20 mg/dL _1 Creatinine 0.44 - 1.00 mg/dL 0.72 0.63 0.67  Sodium 135 - 145 mmol/L 136 138 138  Potassium 3.5 - 5.1 mmol/L 3.3(L) 3.5 3.5   Chloride 98 - 111 mmol/L 106 107 105  CO2 22 - 32 mmol/L 24 21(L) 24  Calcium 8.9 - 10.3 mg/dL 7.9(L) 8.9 9.4  Total Protein 6.5 - 8.1 g/dL - 6.8 7.4  Total Bilirubin 0.3 - 1.2 mg/dL - 0.3 0.4  Alkaline Phos 38 - 126 U/L - 44 62  AST 15 - 41 U/L - 23 17  ALT 0 - 44 U/L - 15 12(L)    Hepatic Function Latest Ref Rng & Units 11/19/2018 03/16/2017 11/02/2016  Total Protein 6.5 - 8.1 g/dL 6.8 7.4 7.0  Albumin 3.5 - 5.0 g/dL 4.1 4.3 3.6  AST 15 - 41 U/L 23 17 43(H)  ALT 0 - 44 U/L 15 12(L) 53  Alk Phosphatase 38 - 126 U/L 44 62 90  Total Bilirubin 0.3 -  1.2 mg/dL 0.3 0.4 0.4  Bilirubin, Direct 0.1 - 0.5 mg/dL - - -      Assessment:  #1.  Chronic GERD.  She is status post antireflux surgery 12 years ago.  Symptoms are now poorly controlled with PPI.  She does not have alarm symptoms such as dysphagia or weight loss.  Suspect wrap is possibly been undone if vascular surgery that she had.  She would benefit from change in PPI therapy and diagnostic esophagogastroduodenoscopy.  #2.  Epigastric pain and bloating.  She could have peptic ulcer disease or the symptoms may be related to constipation.  #3.  Constipation.  Once again she does not have alarm symptoms.  Constipation may be the result of poor eating habits and lack of physical activity.  With weight gain need to rule out hypothyroidism.  However if stool is guaiac positive would consider diagnostic colonoscopy.  #4.  Weight gain despite poor appetite.  Need to rule out hypothyroidism.  #5. patient has multiple tattoos.  Need to screen for hepatitis C.  #6. Hypogastric pain appears to be postop pain.  Recent UA and culture by Dr. Elonda Husky was negative.  Pain only occurs with voiding and defecation and is transient.  Patient reassured.  #7. Review of lab study reveals mild anemia which would appear to be postop.  CBC will be checked.  Recommendations  Antireflux measures reinforced. Patient needs to cut back on coffee intake and drink  more water and she should be eating 3 meals rather than 1 meal. Discontinue omeprazole. CBC, TSH and HCV antibody. Hemoccult x1. Dexlansoprazole 60 mg by mouth 30 minutes before breakfast daily. Famotidine OTC 20 mg p.o. nightly. Polyethylene glycol 8.5 g by mouth daily at bedtime. Patient advised to increase physical activity as tolerated. Diagnostic esophagogastroduodenoscopy with propofol in near future. Office visit in 3 months.

## 2019-02-06 LAB — CBC
HCT: 35.5 % (ref 35.0–45.0)
Hemoglobin: 11.9 g/dL (ref 11.7–15.5)
MCH: 29.1 pg (ref 27.0–33.0)
MCHC: 33.5 g/dL (ref 32.0–36.0)
MCV: 86.8 fL (ref 80.0–100.0)
MPV: 10.5 fL (ref 7.5–12.5)
Platelets: 313 10*3/uL (ref 140–400)
RBC: 4.09 10*6/uL (ref 3.80–5.10)
RDW: 13 % (ref 11.0–15.0)
WBC: 8.5 10*3/uL (ref 3.8–10.8)

## 2019-02-06 LAB — TSH: TSH: 1.67 mIU/L

## 2019-02-11 ENCOUNTER — Other Ambulatory Visit (INDEPENDENT_AMBULATORY_CARE_PROVIDER_SITE_OTHER): Payer: Self-pay | Admitting: *Deleted

## 2019-02-11 ENCOUNTER — Encounter (INDEPENDENT_AMBULATORY_CARE_PROVIDER_SITE_OTHER): Payer: Self-pay | Admitting: *Deleted

## 2019-02-11 DIAGNOSIS — K219 Gastro-esophageal reflux disease without esophagitis: Secondary | ICD-10-CM

## 2019-02-11 DIAGNOSIS — R109 Unspecified abdominal pain: Secondary | ICD-10-CM

## 2019-02-20 ENCOUNTER — Encounter (INDEPENDENT_AMBULATORY_CARE_PROVIDER_SITE_OTHER): Payer: Self-pay | Admitting: *Deleted

## 2019-02-20 ENCOUNTER — Telehealth (INDEPENDENT_AMBULATORY_CARE_PROVIDER_SITE_OTHER): Payer: Self-pay | Admitting: *Deleted

## 2019-02-20 NOTE — Telephone Encounter (Signed)
   Diagnosis:    Result(s)   Card 1: Negative:          Completed by:    HEMOCCULT SENSA DEVELOPER: LOT#:  [id number] EXPIRATION DATE: 2022-06   HEMOCCULT SENSA CARD:  LOT#:  [id number] EXPIRATION DATE: 2022 02   CARD CONTROL RESULTS:  POSITIVE: Positive NEGATIVE: Negative    ADDITIONAL COMMENTS: Patient was called and made aware of the results. Forwarded to [place].

## 2019-03-04 NOTE — Telephone Encounter (Signed)
Stool is guaiac negative. 

## 2019-03-06 ENCOUNTER — Encounter (HOSPITAL_COMMUNITY)
Admission: RE | Admit: 2019-03-06 | Discharge: 2019-03-06 | Disposition: A | Discharge status: Home / Self Care | Payer: Medicare Other | Source: Ambulatory Visit | Attending: Internal Medicine | Admitting: Internal Medicine

## 2019-03-06 ENCOUNTER — Other Ambulatory Visit: Payer: Self-pay

## 2019-03-06 ENCOUNTER — Encounter (HOSPITAL_COMMUNITY): Payer: Self-pay

## 2019-03-06 ENCOUNTER — Other Ambulatory Visit (HOSPITAL_COMMUNITY)
Admission: RE | Admit: 2019-03-06 | Discharge: 2019-03-06 | Disposition: A | Discharge status: Home / Self Care | Payer: Medicare Other | Source: Ambulatory Visit | Attending: Internal Medicine | Admitting: Internal Medicine

## 2019-03-06 DIAGNOSIS — Z01812 Encounter for preprocedural laboratory examination: Secondary | ICD-10-CM | POA: Insufficient documentation

## 2019-03-06 DIAGNOSIS — Z20822 Contact with and (suspected) exposure to covid-19: Secondary | ICD-10-CM | POA: Diagnosis not present

## 2019-03-06 LAB — SARS CORONAVIRUS 2 (TAT 6-24 HRS): SARS Coronavirus 2: NEGATIVE

## 2019-03-08 ENCOUNTER — Encounter (HOSPITAL_COMMUNITY)
Admission: RE | Disposition: A | Discharge status: Home / Self Care | Payer: Self-pay | Source: Home / Self Care | Attending: Internal Medicine

## 2019-03-08 ENCOUNTER — Ambulatory Visit (HOSPITAL_COMMUNITY)
Admission: RE | Admit: 2019-03-08 | Discharge: 2019-03-08 | Disposition: A | Discharge status: Home / Self Care | Payer: Medicare Other | Attending: Internal Medicine | Admitting: Internal Medicine

## 2019-03-08 ENCOUNTER — Ambulatory Visit (HOSPITAL_COMMUNITY): Payer: Medicare Other | Admitting: Anesthesiology

## 2019-03-08 ENCOUNTER — Encounter (HOSPITAL_COMMUNITY): Payer: Self-pay | Admitting: Internal Medicine

## 2019-03-08 ENCOUNTER — Other Ambulatory Visit: Payer: Self-pay

## 2019-03-08 DIAGNOSIS — R112 Nausea with vomiting, unspecified: Secondary | ICD-10-CM | POA: Insufficient documentation

## 2019-03-08 DIAGNOSIS — K219 Gastro-esophageal reflux disease without esophagitis: Secondary | ICD-10-CM | POA: Diagnosis not present

## 2019-03-08 DIAGNOSIS — M1990 Unspecified osteoarthritis, unspecified site: Secondary | ICD-10-CM | POA: Insufficient documentation

## 2019-03-08 DIAGNOSIS — R1013 Epigastric pain: Secondary | ICD-10-CM | POA: Insufficient documentation

## 2019-03-08 DIAGNOSIS — R1033 Periumbilical pain: Secondary | ICD-10-CM | POA: Insufficient documentation

## 2019-03-08 DIAGNOSIS — F17210 Nicotine dependence, cigarettes, uncomplicated: Secondary | ICD-10-CM | POA: Diagnosis not present

## 2019-03-08 DIAGNOSIS — F329 Major depressive disorder, single episode, unspecified: Secondary | ICD-10-CM | POA: Insufficient documentation

## 2019-03-08 DIAGNOSIS — I10 Essential (primary) hypertension: Secondary | ICD-10-CM | POA: Insufficient documentation

## 2019-03-08 DIAGNOSIS — K449 Diaphragmatic hernia without obstruction or gangrene: Secondary | ICD-10-CM | POA: Insufficient documentation

## 2019-03-08 DIAGNOSIS — Z79899 Other long term (current) drug therapy: Secondary | ICD-10-CM | POA: Diagnosis not present

## 2019-03-08 DIAGNOSIS — Z9889 Other specified postprocedural states: Secondary | ICD-10-CM

## 2019-03-08 DIAGNOSIS — R109 Unspecified abdominal pain: Secondary | ICD-10-CM | POA: Insufficient documentation

## 2019-03-08 DIAGNOSIS — K228 Other specified diseases of esophagus: Secondary | ICD-10-CM

## 2019-03-08 HISTORY — PX: BIOPSY: SHX5522

## 2019-03-08 HISTORY — PX: ESOPHAGOGASTRODUODENOSCOPY (EGD) WITH PROPOFOL: SHX5813

## 2019-03-08 SURGERY — ESOPHAGOGASTRODUODENOSCOPY (EGD) WITH PROPOFOL
Anesthesia: General

## 2019-03-08 MED ORDER — DICYCLOMINE HCL 10 MG PO CAPS: 10.0000 mg | ORAL_CAPSULE | Freq: Two times a day (BID) | ORAL | 2 refills | Status: AC | PRN

## 2019-03-08 MED ORDER — GLYCOPYRROLATE 0.2 MG/ML IJ SOLN
INTRAMUSCULAR | Status: DC | PRN
  Administered 2019-03-08: .2 mg via INTRAVENOUS

## 2019-03-08 MED ORDER — HYDROMORPHONE HCL 1 MG/ML IJ SOLN: 0.2500 mg | INTRAMUSCULAR | Status: DC | PRN

## 2019-03-08 MED ORDER — PROPOFOL 10 MG/ML IV BOLUS
INTRAVENOUS | Status: AC
  Filled 2019-03-08: qty 80

## 2019-03-08 MED ORDER — GLYCOPYRROLATE PF 0.2 MG/ML IJ SOSY
PREFILLED_SYRINGE | INTRAMUSCULAR | Status: AC
  Filled 2019-03-08: qty 1

## 2019-03-08 MED ORDER — LACTATED RINGERS IV SOLN: INTRAVENOUS | Status: DC

## 2019-03-08 MED ORDER — MIDAZOLAM HCL 5 MG/5ML IJ SOLN
INTRAMUSCULAR | Status: DC | PRN
  Administered 2019-03-08: 2 mg via INTRAVENOUS

## 2019-03-08 MED ORDER — CHLORHEXIDINE GLUCONATE CLOTH 2 % EX PADS: 6.0000 | MEDICATED_PAD | Freq: Once | CUTANEOUS | Status: DC

## 2019-03-08 MED ORDER — PROMETHAZINE HCL 25 MG/ML IJ SOLN: 6.2500 mg | INTRAMUSCULAR | Status: DC | PRN

## 2019-03-08 MED ORDER — MIDAZOLAM HCL 2 MG/2ML IJ SOLN: 0.5000 mg | Freq: Once | INTRAMUSCULAR | Status: DC | PRN

## 2019-03-08 MED ORDER — MIDAZOLAM HCL 2 MG/2ML IJ SOLN
INTRAMUSCULAR | Status: AC
  Filled 2019-03-08: qty 2

## 2019-03-08 MED ORDER — HYDROCODONE-ACETAMINOPHEN 7.5-325 MG PO TABS: 1.0000 | ORAL_TABLET | Freq: Once | ORAL | Status: DC | PRN

## 2019-03-08 MED ORDER — PROPOFOL 500 MG/50ML IV EMUL
INTRAVENOUS | Status: DC | PRN
  Administered 2019-03-08: 150 ug/kg/min via INTRAVENOUS

## 2019-03-08 NOTE — Anesthesia Preprocedure Evaluation (Signed)
Anesthesia Evaluation  Patient identified by MRN, date of birth, ID band Patient awake    Reviewed: Allergy & Precautions, NPO status , Patient's Chart, lab work & pertinent test results  Airway Mallampati: I  TM Distance: >3 FB Neck ROM: Full    Dental no notable dental hx. (+) Teeth Intact   Pulmonary neg pulmonary ROS, Current SmokerPatient did not abstain from smoking.,  Smoker ~1/2 PPD  Smoked today    Pulmonary exam normal breath sounds clear to auscultation       Cardiovascular Exercise Tolerance: Good hypertension, Pt. on medications negative cardio ROS Normal cardiovascular examI Rhythm:Regular Rate:Normal  Denies CP/DOE when questioned  Reports can walk a mile    Neuro/Psych Depression  Neuromuscular disease negative psych ROS   GI/Hepatic Neg liver ROS, GERD  Medicated and Controlled,  Endo/Other  negative endocrine ROS  Renal/GU negative Renal ROS  negative genitourinary   Musculoskeletal  (+) Arthritis ,   Abdominal   Peds negative pediatric ROS (+)  Hematology negative hematology ROS (+) anemia ,   Anesthesia Other Findings   Reproductive/Obstetrics negative OB ROS                             Anesthesia Physical Anesthesia Plan  ASA: II  Anesthesia Plan: General   Post-op Pain Management:    Induction: Intravenous  PONV Risk Score and Plan: 2 and Propofol infusion, TIVA and Treatment may vary due to age or medical condition  Airway Management Planned: Simple Face Mask and Nasal Cannula  Additional Equipment:   Intra-op Plan:   Post-operative Plan: Extubation in OR  Informed Consent: I have reviewed the patients History and Physical, chart, labs and discussed the procedure including the risks, benefits and alternatives for the proposed anesthesia with the patient or authorized representative who has indicated his/her understanding and acceptance.      Dental advisory given  Plan Discussed with: CRNA  Anesthesia Plan Comments: (Plan Full PPE use  Plan GA with GETA as needed d/w pt -WTP with same after Q&A)        Anesthesia Quick Evaluation

## 2019-03-08 NOTE — Op Note (Signed)
[facility] Patient Name: Yvette West Procedure Date: 03/08/2019 9:34 AM MRN: [id number] Date of Birth: [birth year] Attending MD: Lionel December , MD CSN: [id number] Age: 44 Admit Type: Outpatient Procedure:                Upper GI endoscopy Indications:              Epigastric abdominal pain, Periumbilical abdominal                            pain, Nausea with vomiting Providers:                Lionel December, MD, Loma Messing B. Patsy Lager, RN, Pandora Leiter, Technician Referring MD:             [facility] Medicines:                Propofol per Anesthesia Complications:            No immediate complications. Estimated Blood Loss:     Estimated blood loss was minimal. Procedure:                Pre-Anesthesia Assessment:                           - Prior to the procedure, a History and Physical                            was performed, and patient medications and                            allergies were reviewed. The patient's tolerance of                            previous anesthesia was also reviewed. The risks                            and benefits of the procedure and the sedation                            options and risks were discussed with the patient.                            All questions were answered, and informed consent                            was obtained. Prior Anticoagulants: The patient has                            taken no previous anticoagulant or antiplatelet                            agents. ASA Grade Assessment: II - A patient with  mild systemic disease. After reviewing the risks                            and benefits, the patient was deemed in                            satisfactory condition to undergo the procedure.                           After obtaining informed consent, the endoscope was                            passed under direct vision. Throughout the           procedure, the patient's blood pressure, pulse, and                            oxygen saturations were monitored continuously. The                            [id number]-H190 ([id number]) scope was introduced through the                            mouth, and advanced to the second part of duodenum.                            The upper GI endoscopy was accomplished without                            difficulty. The patient tolerated the procedure                            well. Scope In: 9:59:49 AM Scope Out: 10:07:05 AM Total Procedure Duration: 0 hours 7 minutes 16 seconds  Findings:      The examined esophagus was normal.      The Z-line was irregular and was found 35 cm from the incisors. Biopsies       were taken with a cold forceps for histology.      A 2 cm hiatal hernia was present.      Evidence of a fundoplication was found in the cardia. The wrap appeared       loose.      The exam of the stomach was otherwise normal.      The duodenal bulb and second portion of the duodenum were normal. Impression:               - Normal esophagus.                           - Z-line irregular, 35 cm from the incisors.                            Biopsied.                           - 2 cm hiatal hernia.                           -  A fundoplication was found. The wrap appears                            loose.                           - Normal duodenal bulb and second portion of the                            duodenum. Moderate Sedation:      Per Anesthesia Care Recommendation:           - Patient has a contact number available for                            emergencies. The signs and symptoms of potential                            delayed complications were discussed with the                            patient. Return to normal activities tomorrow.                            Written discharge instructions were provided to the                            patient.                           -  Resume previous diet today.                           - Continue present medications.                           - No aspirin, ibuprofen, naproxen, or other                            non-steroidal anti-inflammatory drugs for 1 day.                           - Use Bentyl (dicyclomine) 10 mg PO BID prn.                           - Await pathology results.                           - Perform CT scan (computed tomography) of the                            abdomen and pelvis with contrast to be scheduled. Procedure Code(s):        --- Professional ---                           [phone], Esophagogastroduodenoscopy, flexible,  transoral; with biopsy, single or multiple Diagnosis Code(s):        --- Professional ---                           K22.8, Other specified diseases of esophagus                           K44.9, Diaphragmatic hernia without obstruction or                            gangrene                           Z98.890, Other specified postprocedural states                           R10.13, Epigastric pain                           K02.54, Periumbilical pain                           R11.2, Nausea with vomiting, unspecified CPT copyright 2019 [employer]. All rights reserved. The codes documented in this report are preliminary and upon coder review may  be revised to meet current compliance requirements. Hildred Laser, MD Hildred Laser, MD 03/08/2019 10:17:54 AM This report has been signed electronically. Number of Addenda: 0

## 2019-03-08 NOTE — H&P (Signed)
Yvette West is an 44 y.o. female.   Chief Complaint: Patient is here for esophagogastroduodenoscopy. HPI: Patient is 44 year old Afro-American female who presents with epigastric and periumbilical pain nausea intermittent vomiting usually occurring twice a week as well as heartburn poorly controlled with therapy.  She is status post antireflux surgery in 2008.  No history of hematemesis melena or rectal bleeding.  She also has history of constipation but doing better with medication.  She does not drink alcohol.  She smokes less than half a pack per day. He is referred to my note from 02/05/2019 for rest of the details.  Past Medical History:  Diagnosis Date  . Anginal pain (HCC)   . Arthritis   . Chronic back pain    managed at [facility]  . Depression   . Endometriosis   . GERD (gastroesophageal reflux disease)   . Hypertension   . Lumbar radiculopathy   . Pinched nerve   . Scoliosis   . Scoliosis     Past Surgical History:  Procedure Laterality Date  . ABDOMINAL HYSTERECTOMY N/A 11/21/2018   Procedure: TOTAL ABDOMINAL HYSTERECTOMY;  Surgeon: Lazaro Arms, MD;  Location: [facility];  Service: Gynecology;  Laterality: N/A;  . BTL    . CESAREAN SECTION     x2  . CYST REMOVAL HAND     left wrist   . DILITATION & CURRETTAGE/HYSTROSCOPY WITH NOVASURE ABLATION N/A 10/14/2015   Procedure: DILATATION & CURETTAGE/HYSTEROSCOPY WITH NOVASURE ABLATION;  Surgeon: Lazaro Arms, MD;  Location: [facility];  Service: Gynecology;  Laterality: N/A;  novasure length 6.5 width 4.5 power 161 time 47 seconds  . HERNIA REPAIR     umbilical  . SALPINGOOPHORECTOMY Bilateral 11/21/2018   Procedure: BILATERAL SALPINGO OOPHORECTOMY;  Surgeon: Lazaro Arms, MD;  Location: [facility];  Service: Gynecology;  Laterality: Bilateral;  . SPINAL FUSION  09/2016  . TUBAL LIGATION      Family History  Problem Relation Age of Onset  . Hypertension Mother   . Cancer Sister        cervical  . Colon cancer Paternal  Grandmother   . Cancer Maternal Uncle        lung   Social History:  reports that she has been smoking cigarettes. She has a 5.00 pack-year smoking history. She has never used smokeless tobacco. She reports that she does not drink alcohol or use drugs.  Allergies:  Allergies  Allergen Reactions  . Lisinopril Cough  . Naproxen Sodium Nausea And Vomiting    Medications Prior to Admission  Medication Sig Dispense Refill  . amLODipine (NORVASC) 10 MG tablet Take 10 mg by mouth daily.    . citalopram (CELEXA) 40 MG tablet Take 40 mg by mouth daily.    Marland Kitchen dexlansoprazole (DEXILANT) 60 MG capsule Take 1 capsule (60 mg total) by mouth daily before breakfast. 30 capsule 5  . diphenhydrAMINE (BENADRYL) 25 mg capsule Take 25 mg by mouth as needed for allergies.    . diphenhydramine-acetaminophen (TYLENOL PM) 25-500 MG TABS tablet Take 1-2 tablets by mouth at bedtime as needed (back pain).     Marland Kitchen docusate sodium (COLACE) 100 MG capsule Take 200 mg by mouth daily.     Marland Kitchen estradiol (ESTRACE) 2 MG tablet Take 1 tablet (2 mg total) by mouth daily. 30 tablet 11  . Multiple Vitamin (MULTIVITAMIN) tablet Take 1 tablet by mouth 2 (two) times a week.     . famotidine (PEPCID) 20 MG tablet Take 1 tablet (  20 mg total) by mouth at bedtime. (Patient not taking: Reported on 03/07/2019)    . ondansetron (ZOFRAN) 8 MG tablet Take 1 tablet (8 mg total) by mouth every 6 (six) hours as needed for nausea. (Patient not taking: Reported on 03/07/2019) 20 tablet 0  . polyethylene glycol powder (GLYCOLAX/MIRALAX) 17 GM/SCOOP powder Take 8.5 g by mouth daily. (Patient not taking: Reported on 03/07/2019) 255 g 0    No results found for this or any previous visit (from the past 48 hour(s)). No results found.  Review of Systems  Blood pressure 134/88, pulse 74, temperature 98.6 F (37 C), temperature source Oral, resp. rate (!) 22, last menstrual period 11/01/2018. Physical Exam  Constitutional: She appears well-developed and  well-nourished.  HENT:  Mouth/Throat: Oropharynx is clear and moist.  Eyes: Conjunctivae are normal. No scleral icterus.  Neck: No thyromegaly present.  Cardiovascular: Normal rate, regular rhythm and normal heart sounds.  No murmur heard. Respiratory: Effort normal and breath sounds normal.  GI:  Abdomen is full with laparoscopy scars across upper abdomen.  On palpation is soft.  She has mild tenderness in epigastric region and mild to moderate periumbilical tenderness.  No organomegaly or masses.  Musculoskeletal:        General: No edema.  Lymphadenopathy:    She has no cervical adenopathy.  Neurological: She is alert.  Skin: Skin is warm and dry.     Assessment/Plan Poorly controlled symptoms of GERD. Epigastric and periumbilical pain with nausea and vomiting. Diagnostic esophagogastroduodenoscopy.  Hildred Laser, MD 03/08/2019, 9:50 AM

## 2019-03-08 NOTE — Transfer of Care (Signed)
Immediate Anesthesia Transfer of Care Note  Patient: Yvette West  Procedure(s) Performed: ESOPHAGOGASTRODUODENOSCOPY (EGD) WITH PROPOFOL (N/A ) BIOPSY  Patient Location: PACU  Anesthesia Type:MAC  Level of Consciousness: awake, alert , oriented and patient cooperative  Airway & Oxygen Therapy: Patient Spontanous Breathing  Post-op Assessment: Report given to RN and Post -op Vital signs reviewed and stable  Post vital signs: Reviewed and stable  Last Vitals:  Vitals Value Taken Time  BP    Temp    Pulse 79 03/08/19 1015  Resp 17 03/08/19 1015  SpO2 97 % 03/08/19 1015  Vitals shown include unvalidated device data.  Last Pain:  Vitals:   03/08/19 0827  TempSrc: Oral  PainSc: 6       Patients Stated Pain Goal: 8 (09/81/19 1478)  Complications: No apparent anesthesia complications

## 2019-03-08 NOTE — Anesthesia Postprocedure Evaluation (Signed)
Anesthesia Post Note  Patient: Yvette West  Procedure(s) Performed: ESOPHAGOGASTRODUODENOSCOPY (EGD) WITH PROPOFOL (N/A ) BIOPSY  Patient location during evaluation: PACU Anesthesia Type: General Level of consciousness: awake, oriented and patient cooperative Pain management: pain level controlled Vital Signs Assessment: post-procedure vital signs reviewed and stable Respiratory status: spontaneous breathing, respiratory function stable and nonlabored ventilation Cardiovascular status: stable Postop Assessment: no apparent nausea or vomiting Anesthetic complications: no     Last Vitals:  Vitals:   03/08/19 0827  BP: 134/88  Pulse: 74  Resp: (!) 22  Temp: 37 C    Last Pain:  Vitals:   03/08/19 0827  TempSrc: Oral  PainSc: 6                  Sayda Grable

## 2019-03-08 NOTE — Discharge Instructions (Signed)
No aspirin or NSAIDs for 24 hours. Resume usual medications as before. Consider taking stool softener/Colace at bedtime. Dicyclomine 10 mg by mouth twice daily as needed for abdominal pain. Resume usual diet. No driving for 24 hours. Abdominopelvic CT to be scheduled. Office will call. Physician will call with biopsy results.

## 2019-03-11 ENCOUNTER — Other Ambulatory Visit (INDEPENDENT_AMBULATORY_CARE_PROVIDER_SITE_OTHER): Payer: Self-pay | Admitting: *Deleted

## 2019-03-11 DIAGNOSIS — R112 Nausea with vomiting, unspecified: Secondary | ICD-10-CM

## 2019-03-11 DIAGNOSIS — R1033 Periumbilical pain: Secondary | ICD-10-CM

## 2019-03-11 DIAGNOSIS — R1013 Epigastric pain: Secondary | ICD-10-CM

## 2019-03-11 LAB — SURGICAL PATHOLOGY

## 2019-03-26 ENCOUNTER — Other Ambulatory Visit: Payer: Self-pay

## 2019-03-26 ENCOUNTER — Ambulatory Visit (HOSPITAL_COMMUNITY)
Admission: RE | Admit: 2019-03-26 | Discharge: 2019-03-26 | Disposition: A | Discharge status: Home / Self Care | Payer: Medicare Other | Source: Ambulatory Visit | Attending: Internal Medicine | Admitting: Internal Medicine

## 2019-03-26 DIAGNOSIS — R1013 Epigastric pain: Secondary | ICD-10-CM | POA: Diagnosis present

## 2019-03-26 DIAGNOSIS — R1033 Periumbilical pain: Secondary | ICD-10-CM | POA: Diagnosis present

## 2019-03-26 DIAGNOSIS — R112 Nausea with vomiting, unspecified: Secondary | ICD-10-CM | POA: Diagnosis not present

## 2019-03-26 LAB — POCT I-STAT CREATININE: Creatinine, Ser: 0.6 mg/dL (ref 0.44–1.00)

## 2019-03-26 MED ORDER — IOHEXOL 300 MG/ML  SOLN
100.0000 mL | Freq: Once | INTRAMUSCULAR | Status: AC | PRN
  Administered 2019-03-26: 100 mL via INTRAVENOUS

## 2019-05-23 ENCOUNTER — Encounter (INDEPENDENT_AMBULATORY_CARE_PROVIDER_SITE_OTHER): Payer: Self-pay

## 2019-05-27 ENCOUNTER — Ambulatory Visit (INDEPENDENT_AMBULATORY_CARE_PROVIDER_SITE_OTHER): Payer: Medicaid Other | Admitting: Internal Medicine

## 2020-04-20 ENCOUNTER — Other Ambulatory Visit: Payer: Self-pay | Admitting: Obstetrics & Gynecology

## 2020-11-19 IMAGING — MG MM DIGITAL SCREENING BILAT W/ TOMO W/ CAD
8 series · 8 of 24 positions shown · non-contrast
Comparison: Previous exam(s).

CLINICAL DATA: Screening.

EXAM:
DIGITAL SCREENING BILATERAL MAMMOGRAM WITH TOMO AND CAD

[R MLO synth-2D]
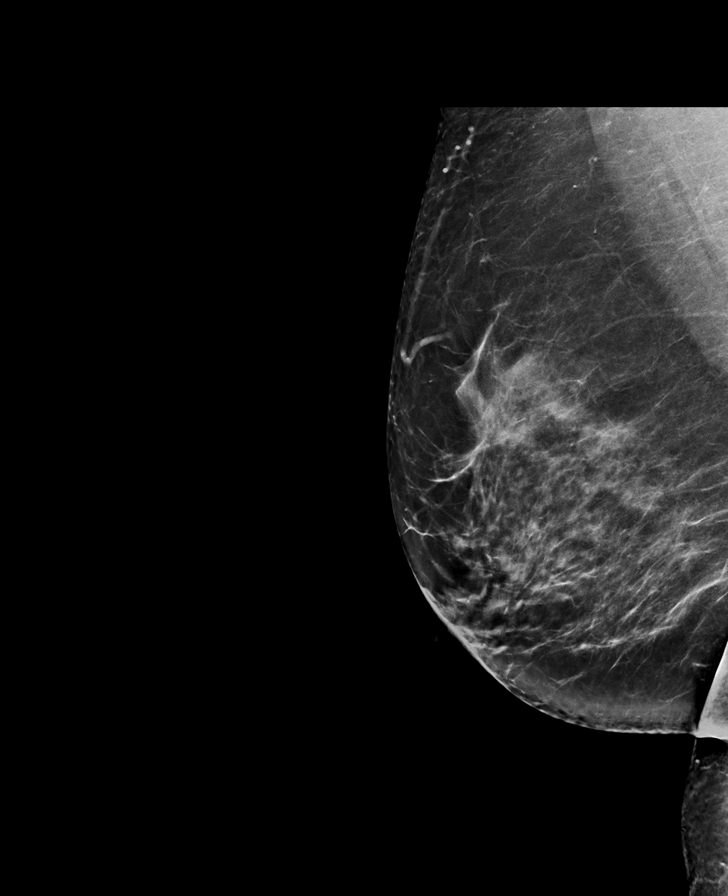

[L MLO synth-2D]
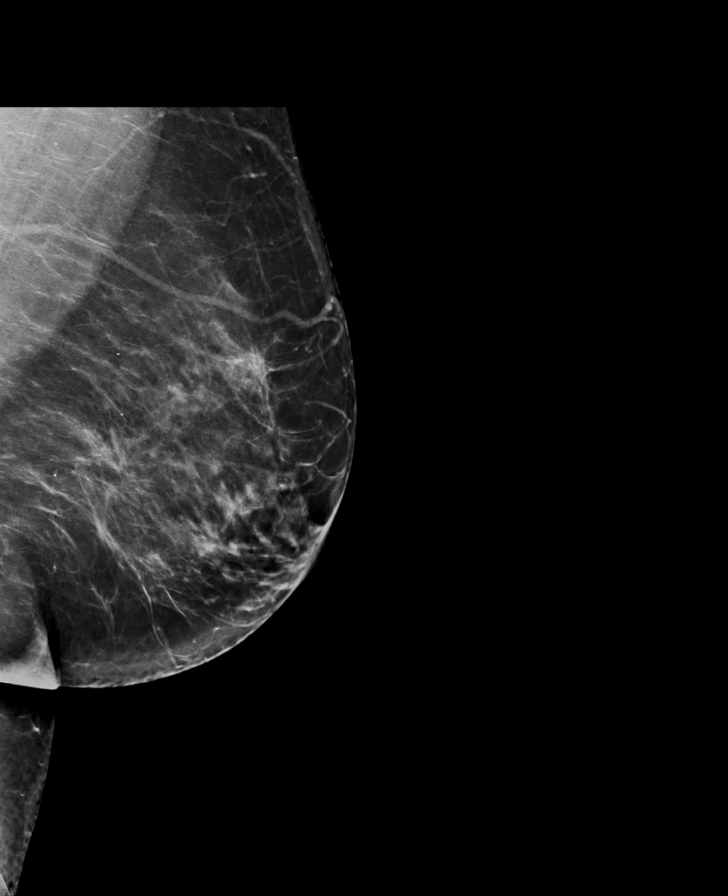

[L CC synth-2D]
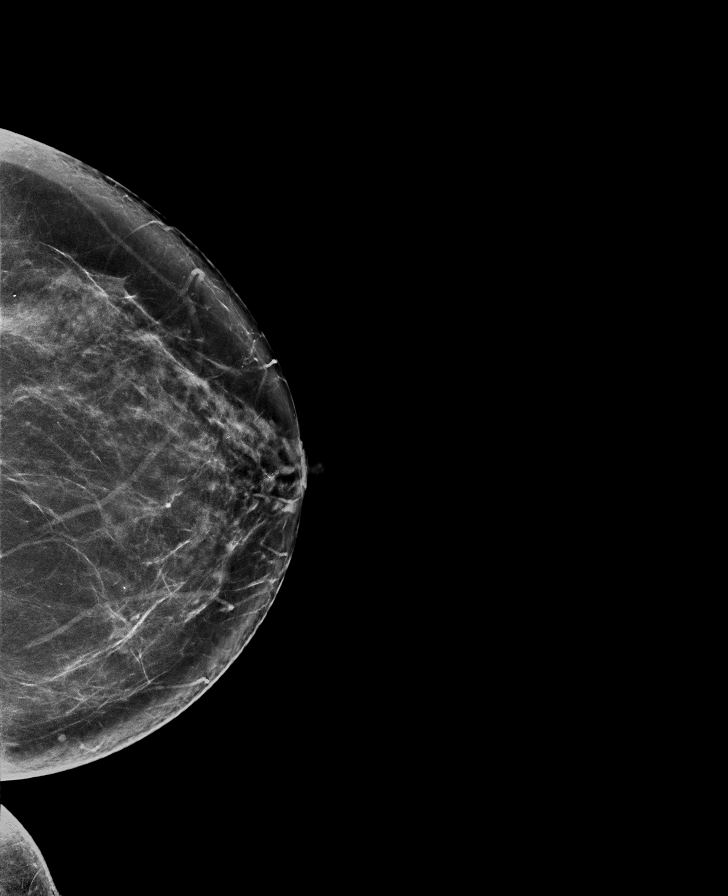

[R CC synth-2D]
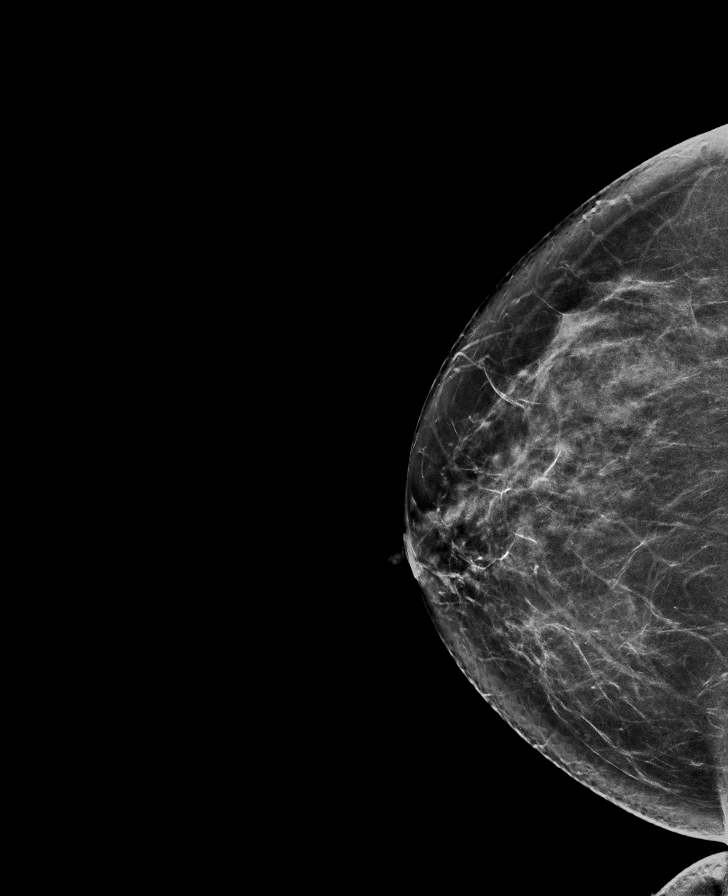

[R MLO tomo · tomo slice 43/84.0]
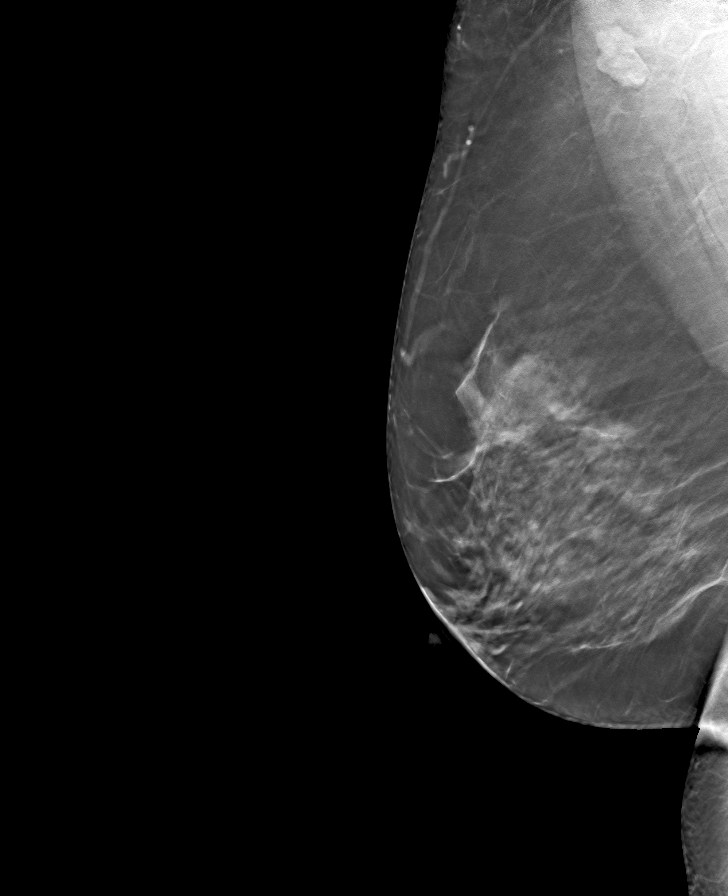

[L MLO tomo · tomo slice 44/87.0]
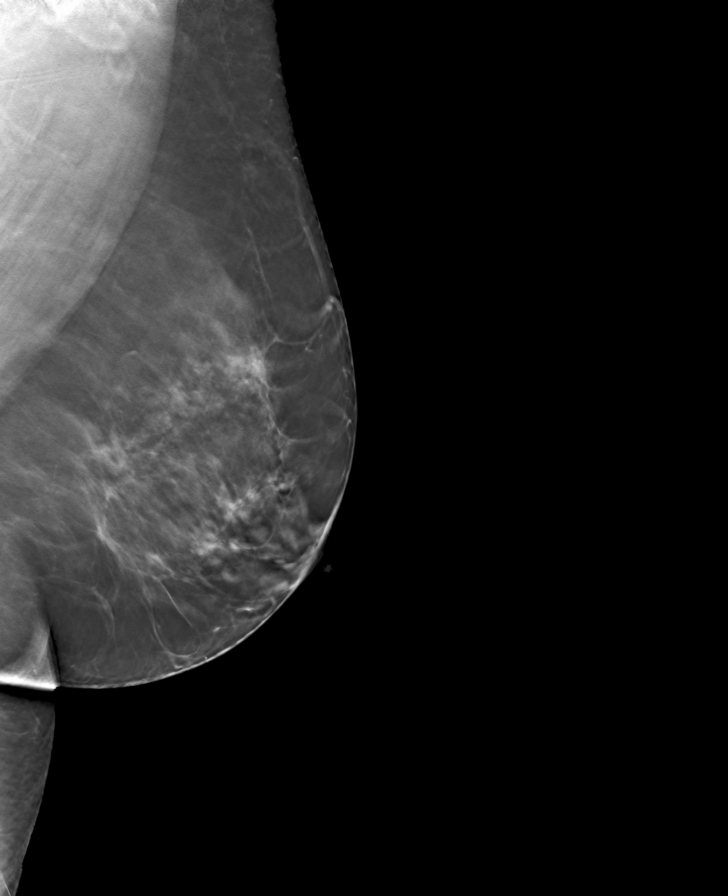

[R CC tomo · tomo slice 39/78.0]
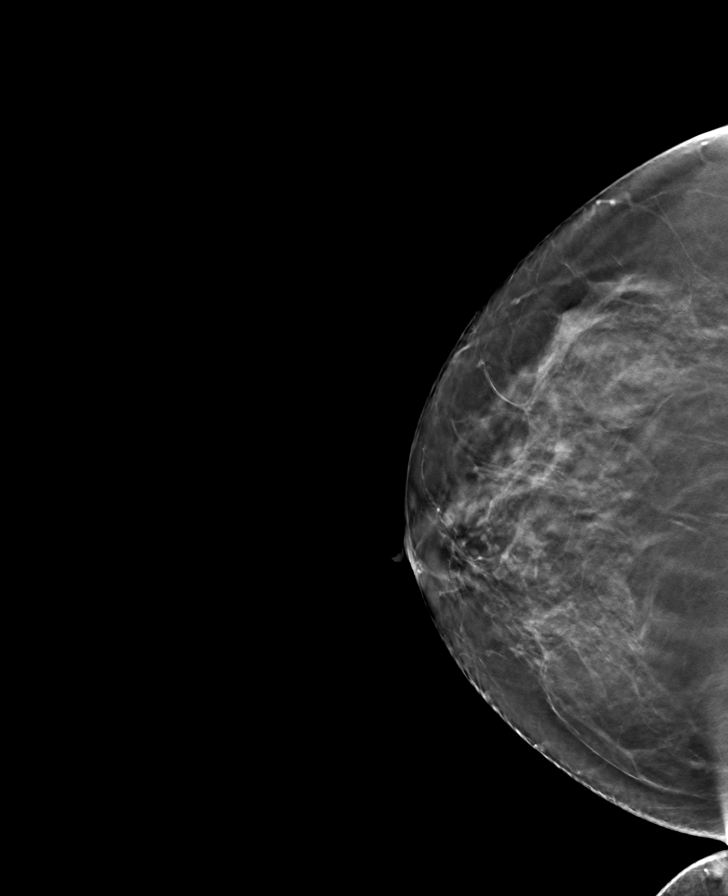

[L CC tomo · tomo slice 41/82.0]
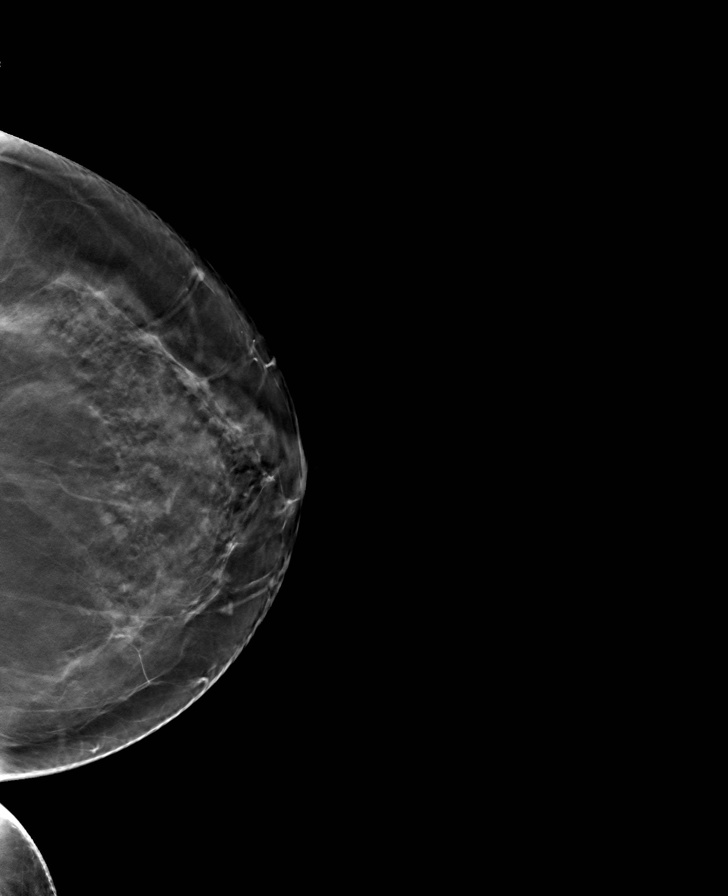

[8 of 24 positions shown; findings below may reference images not displayed]

ACR Breast Density Category b: There are scattered areas of
fibroglandular density.
FINDINGS: There are no findings suspicious for malignancy. Images were
processed with CAD.
IMPRESSION: No mammographic evidence of malignancy. A result letter of this
screening mammogram will be mailed directly to the patient.

RECOMMENDATION:
Screening mammogram in one year. (Code:[id number])

BI-RADS CATEGORY  1: Negative.

## 2021-04-19 ENCOUNTER — Other Ambulatory Visit: Payer: Self-pay | Admitting: Obstetrics & Gynecology

## 2021-09-24 ENCOUNTER — Other Ambulatory Visit: Payer: Self-pay | Admitting: Obstetrics & Gynecology

## 2022-01-08 ENCOUNTER — Encounter (INDEPENDENT_AMBULATORY_CARE_PROVIDER_SITE_OTHER): Payer: Self-pay | Admitting: Gastroenterology

## 2022-11-22 ENCOUNTER — Other Ambulatory Visit: Payer: Self-pay | Admitting: Obstetrics & Gynecology
